# Patient Record
Sex: Male | Born: 1955
Health system: Southern US, Community
[De-identification: ages and names within clinical notes are randomized; demographics above are authoritative.]

## PROBLEM LIST (undated history)

## (undated) DIAGNOSIS — F329 Major depressive disorder, single episode, unspecified: Secondary | ICD-10-CM

## (undated) DIAGNOSIS — E039 Hypothyroidism, unspecified: Secondary | ICD-10-CM

## (undated) DIAGNOSIS — I10 Essential (primary) hypertension: Secondary | ICD-10-CM

## (undated) DIAGNOSIS — F32A Depression, unspecified: Secondary | ICD-10-CM

## (undated) DIAGNOSIS — F419 Anxiety disorder, unspecified: Secondary | ICD-10-CM

## (undated) DIAGNOSIS — E079 Disorder of thyroid, unspecified: Secondary | ICD-10-CM

## (undated) DIAGNOSIS — E119 Type 2 diabetes mellitus without complications: Secondary | ICD-10-CM

---

## 1995-06-14 HISTORY — PX: COSMETIC SURGERY: SHX468

## 2010-11-09 ENCOUNTER — Ambulatory Visit: Payer: Self-pay | Admitting: Internal Medicine

## 2012-11-12 ENCOUNTER — Ambulatory Visit: Payer: Self-pay | Admitting: Emergency Medicine

## 2012-11-12 LAB — DOT URINE DIP
Blood: NEGATIVE
Glucose,UR: NEGATIVE mg/dL (ref 0–75)
Protein: NEGATIVE
Specific Gravity: 1.02 (ref 1.003–1.030)

## 2012-11-17 IMAGING — CR DG CHEST 2V
1 series · 2 of 2 positions shown · non-contrast
Comparison: none

REASON FOR EXAM: Bilateral Wheezes; expectorating yellow phlegm
COMMENTS:   LMP: (Male)

PROCEDURE:     MDR - MDR CHEST PA(OR AP) AND LATERAL  - November 09, 2010  [DATE]
RESULT:     The lungs are clear. The heart and pulmonary vessels are normal.
The bony and mediastinal structures are unremarkable. There is no effusion.
There is no pneumothorax or evidence of congestive failure.

[Series 1: view not recorded · 0.17mm/px · 2 of 2 slices shown]
[im 1/2]
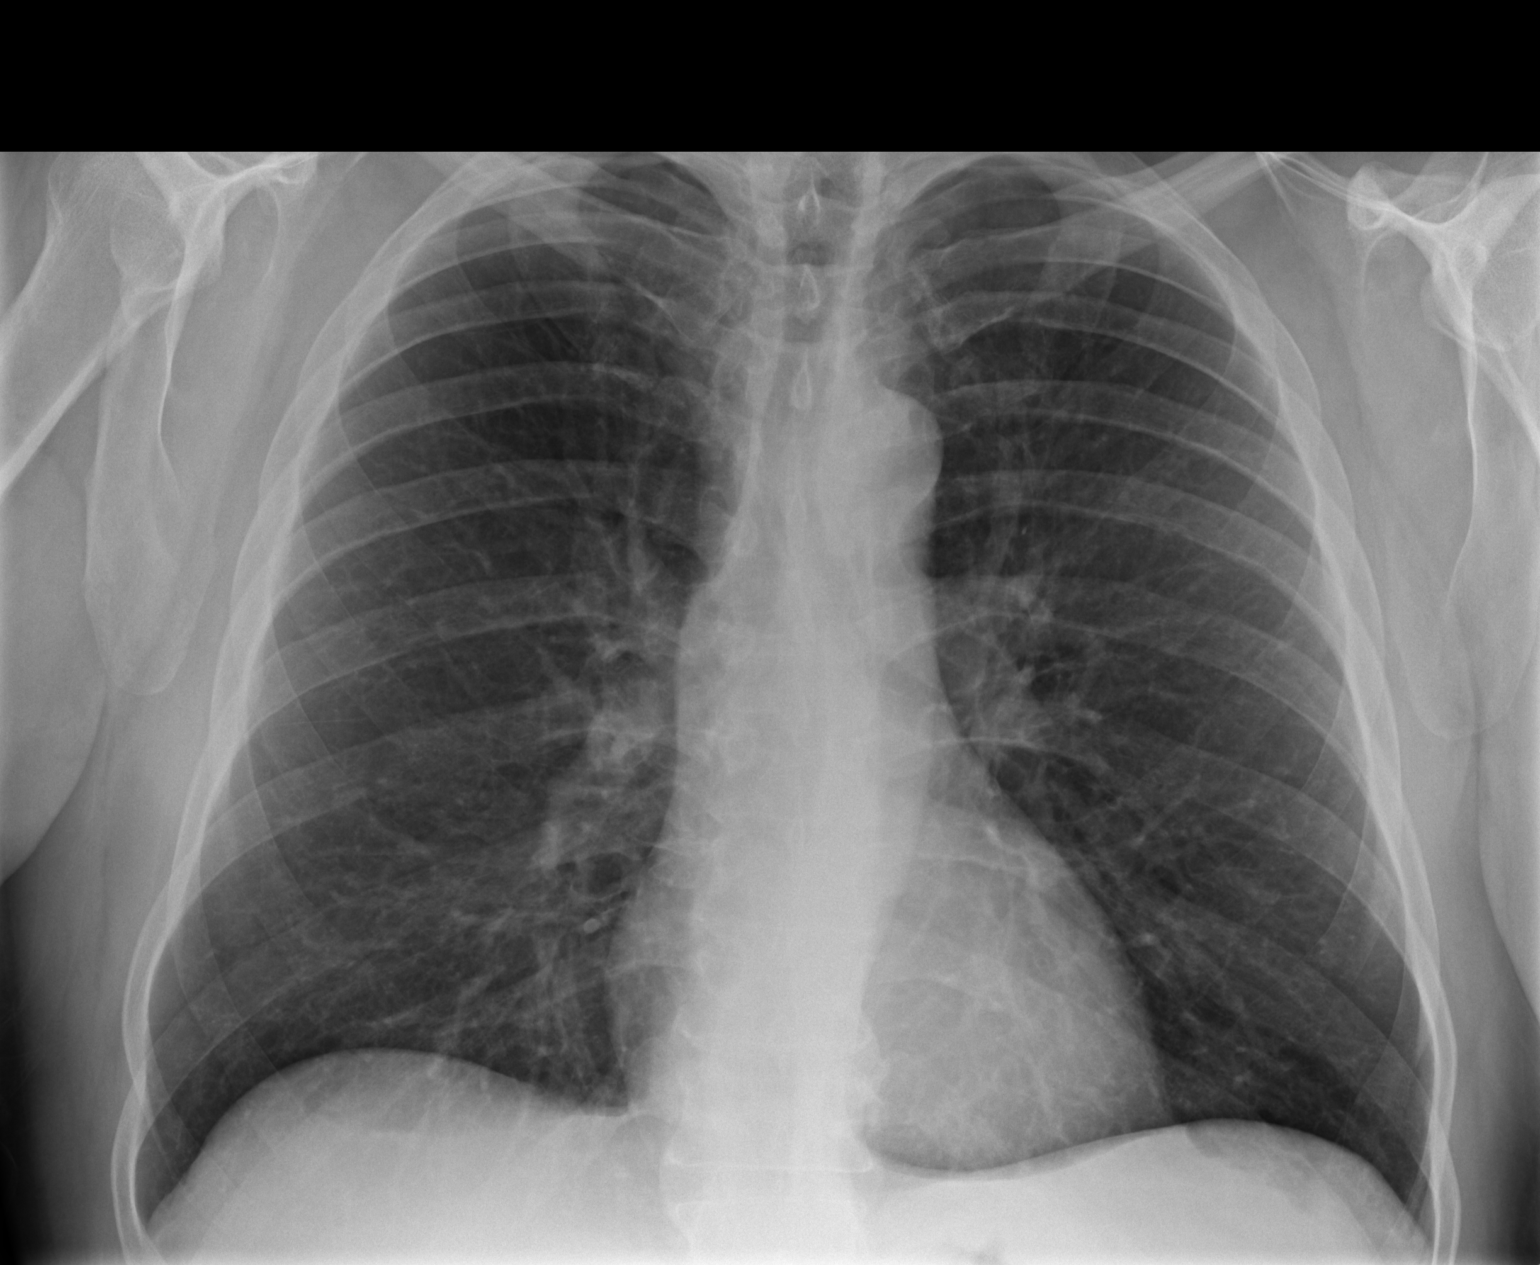
[im 2/2]
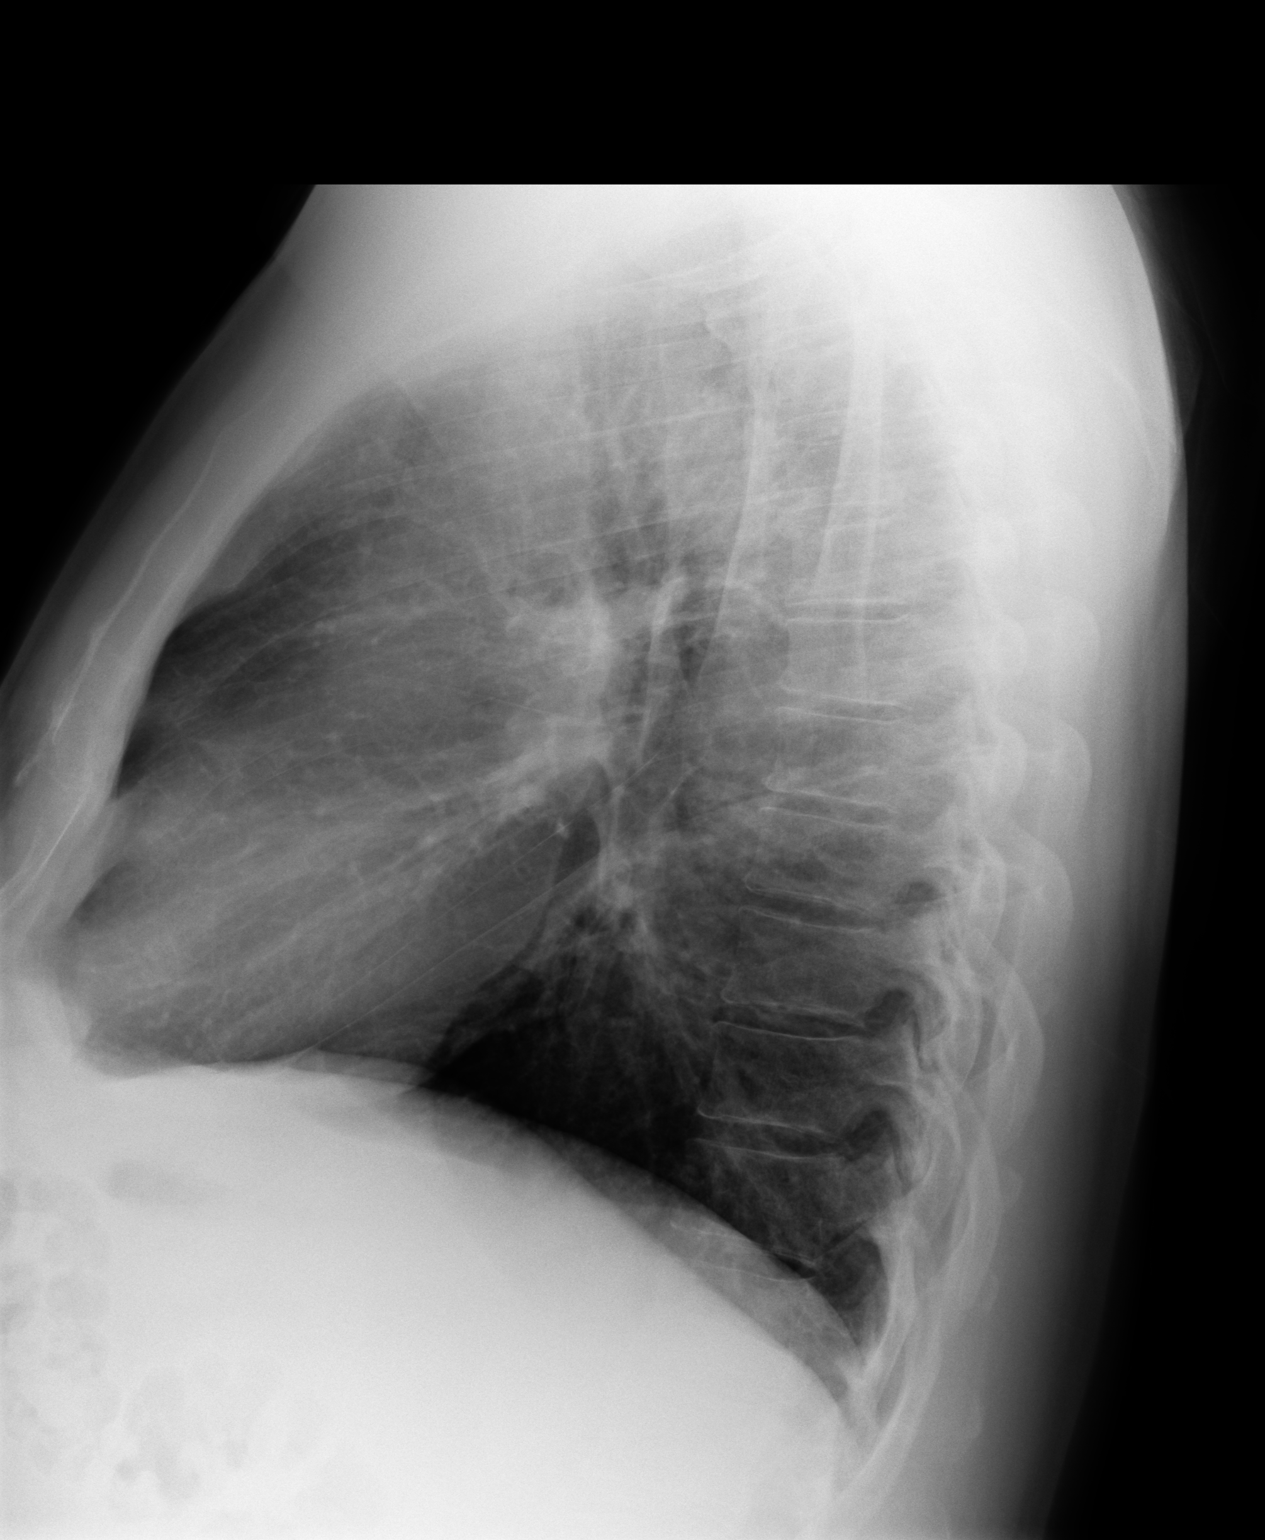

[2 of 2 positions shown; findings below may reference images not displayed]

IMPRESSION: No acute cardiopulmonary disease.

## 2014-11-04 ENCOUNTER — Encounter: Payer: Self-pay | Admitting: Family Medicine

## 2014-11-04 ENCOUNTER — Ambulatory Visit
Admission: EM | Admit: 2014-11-04 | Discharge: 2014-11-04 | Disposition: A | Discharge status: Home / Self Care | Payer: Self-pay | Attending: Family Medicine | Admitting: Family Medicine

## 2014-11-04 DIAGNOSIS — Z024 Encounter for examination for driving license: Secondary | ICD-10-CM

## 2014-11-04 DIAGNOSIS — Z029 Encounter for administrative examinations, unspecified: Secondary | ICD-10-CM

## 2014-11-04 HISTORY — DX: Type 2 diabetes mellitus without complications: E11.9

## 2014-11-04 HISTORY — DX: Essential (primary) hypertension: I10

## 2014-11-04 HISTORY — DX: Disorder of thyroid, unspecified: E07.9

## 2014-11-04 LAB — DEPT OF TRANSP DIPSTICK, URINE (ARMC ONLY)
Glucose, UA: NEGATIVE mg/dL
HGB URINE DIPSTICK: NEGATIVE
PROTEIN: NEGATIVE mg/dL
Specific Gravity, Urine: 1.025 (ref 1.005–1.030)

## 2014-11-04 NOTE — ED Notes (Signed)
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

## 2014-11-04 NOTE — Discharge Instructions (Signed)
Medical Screening Exam °A medical screening exam has been done. This exam helps find the cause of your problem and determines whether you need emergency treatment. Your exam has shown that you do not need emergency treatment at this point. It is safe for you to go to your caregiver's office or clinic for treatment. You should make an appointment today to see your caregiver as soon as he or she is available. °Depending on your illness, your symptoms and condition can change over time. If your condition gets worse or you develop new or troubling symptoms before you see your caregiver, you should return to the emergency department for further evaluation.  °Document Released: 07/07/2004 Document Revised: 08/22/2011 Document Reviewed: 02/16/2011 °ExitCare® Patient Information ©2015 [employer]. This information is not intended to replace advice given to you by your health care provider. Make sure you discuss any questions you have with your health care provider. ° °
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
## 2015-10-02 DIAGNOSIS — E784 Other hyperlipidemia: Secondary | ICD-10-CM | POA: Diagnosis not present

## 2015-10-02 DIAGNOSIS — C61 Malignant neoplasm of prostate: Secondary | ICD-10-CM | POA: Diagnosis not present

## 2015-10-02 DIAGNOSIS — I10 Essential (primary) hypertension: Secondary | ICD-10-CM | POA: Diagnosis not present

## 2015-10-09 DIAGNOSIS — I4510 Unspecified right bundle-branch block: Secondary | ICD-10-CM | POA: Diagnosis not present

## 2015-10-09 DIAGNOSIS — E784 Other hyperlipidemia: Secondary | ICD-10-CM | POA: Diagnosis not present

## 2015-10-23 DIAGNOSIS — H60399 Other infective otitis externa, unspecified ear: Secondary | ICD-10-CM | POA: Diagnosis not present

## 2015-10-29 DIAGNOSIS — H60399 Other infective otitis externa, unspecified ear: Secondary | ICD-10-CM | POA: Diagnosis not present

## 2015-11-06 DIAGNOSIS — H60399 Other infective otitis externa, unspecified ear: Secondary | ICD-10-CM | POA: Diagnosis not present

## 2015-11-06 DIAGNOSIS — E039 Hypothyroidism, unspecified: Secondary | ICD-10-CM | POA: Diagnosis not present

## 2015-11-06 DIAGNOSIS — H6690 Otitis media, unspecified, unspecified ear: Secondary | ICD-10-CM | POA: Diagnosis not present

## 2015-11-06 DIAGNOSIS — I10 Essential (primary) hypertension: Secondary | ICD-10-CM | POA: Diagnosis not present

## 2016-01-29 DIAGNOSIS — I4510 Unspecified right bundle-branch block: Secondary | ICD-10-CM | POA: Diagnosis not present

## 2016-01-29 DIAGNOSIS — H60399 Other infective otitis externa, unspecified ear: Secondary | ICD-10-CM | POA: Diagnosis not present

## 2016-01-29 DIAGNOSIS — I10 Essential (primary) hypertension: Secondary | ICD-10-CM | POA: Diagnosis not present

## 2016-01-29 DIAGNOSIS — E784 Other hyperlipidemia: Secondary | ICD-10-CM | POA: Diagnosis not present

## 2016-02-09 DIAGNOSIS — Z23 Encounter for immunization: Secondary | ICD-10-CM | POA: Diagnosis not present

## 2016-02-09 DIAGNOSIS — F17200 Nicotine dependence, unspecified, uncomplicated: Secondary | ICD-10-CM | POA: Diagnosis not present

## 2016-02-09 DIAGNOSIS — Z21 Asymptomatic human immunodeficiency virus [HIV] infection status: Secondary | ICD-10-CM | POA: Diagnosis not present

## 2016-02-09 DIAGNOSIS — W228XXA Striking against or struck by other objects, initial encounter: Secondary | ICD-10-CM | POA: Diagnosis not present

## 2016-02-09 DIAGNOSIS — S01511A Laceration without foreign body of lip, initial encounter: Secondary | ICD-10-CM | POA: Diagnosis not present

## 2016-06-03 DIAGNOSIS — E039 Hypothyroidism, unspecified: Secondary | ICD-10-CM | POA: Diagnosis not present

## 2016-06-03 DIAGNOSIS — E784 Other hyperlipidemia: Secondary | ICD-10-CM | POA: Diagnosis not present

## 2016-06-03 DIAGNOSIS — H6690 Otitis media, unspecified, unspecified ear: Secondary | ICD-10-CM | POA: Diagnosis not present

## 2016-06-03 DIAGNOSIS — H9190 Unspecified hearing loss, unspecified ear: Secondary | ICD-10-CM | POA: Diagnosis not present

## 2016-08-05 DIAGNOSIS — E039 Hypothyroidism, unspecified: Secondary | ICD-10-CM | POA: Diagnosis not present

## 2016-08-05 DIAGNOSIS — H6690 Otitis media, unspecified, unspecified ear: Secondary | ICD-10-CM | POA: Diagnosis not present

## 2016-08-05 DIAGNOSIS — E785 Hyperlipidemia, unspecified: Secondary | ICD-10-CM | POA: Diagnosis not present

## 2016-08-05 DIAGNOSIS — H9190 Unspecified hearing loss, unspecified ear: Secondary | ICD-10-CM | POA: Diagnosis not present

## 2016-11-21 DIAGNOSIS — I10 Essential (primary) hypertension: Secondary | ICD-10-CM | POA: Diagnosis not present

## 2016-11-21 DIAGNOSIS — E079 Disorder of thyroid, unspecified: Secondary | ICD-10-CM | POA: Diagnosis not present

## 2016-11-21 DIAGNOSIS — H60399 Other infective otitis externa, unspecified ear: Secondary | ICD-10-CM | POA: Diagnosis not present

## 2016-11-21 DIAGNOSIS — J209 Acute bronchitis, unspecified: Secondary | ICD-10-CM | POA: Diagnosis not present

## 2016-11-22 DIAGNOSIS — E784 Other hyperlipidemia: Secondary | ICD-10-CM | POA: Diagnosis not present

## 2016-11-22 DIAGNOSIS — I10 Essential (primary) hypertension: Secondary | ICD-10-CM | POA: Diagnosis not present

## 2016-11-22 DIAGNOSIS — Z125 Encounter for screening for malignant neoplasm of prostate: Secondary | ICD-10-CM | POA: Diagnosis not present

## 2016-11-22 DIAGNOSIS — R5381 Other malaise: Secondary | ICD-10-CM | POA: Diagnosis not present

## 2016-11-30 DIAGNOSIS — E119 Type 2 diabetes mellitus without complications: Secondary | ICD-10-CM | POA: Diagnosis not present

## 2016-11-30 DIAGNOSIS — E079 Disorder of thyroid, unspecified: Secondary | ICD-10-CM | POA: Diagnosis not present

## 2016-11-30 DIAGNOSIS — H6690 Otitis media, unspecified, unspecified ear: Secondary | ICD-10-CM | POA: Diagnosis not present

## 2016-11-30 DIAGNOSIS — H60399 Other infective otitis externa, unspecified ear: Secondary | ICD-10-CM | POA: Diagnosis not present

## 2016-12-02 DIAGNOSIS — E119 Type 2 diabetes mellitus without complications: Secondary | ICD-10-CM | POA: Diagnosis not present

## 2017-04-28 DIAGNOSIS — D485 Neoplasm of uncertain behavior of skin: Secondary | ICD-10-CM | POA: Diagnosis not present

## 2017-04-28 DIAGNOSIS — Z85828 Personal history of other malignant neoplasm of skin: Secondary | ICD-10-CM | POA: Diagnosis not present

## 2017-04-28 DIAGNOSIS — B079 Viral wart, unspecified: Secondary | ICD-10-CM | POA: Diagnosis not present

## 2017-07-25 DIAGNOSIS — K644 Residual hemorrhoidal skin tags: Secondary | ICD-10-CM | POA: Diagnosis not present

## 2017-07-25 DIAGNOSIS — K648 Other hemorrhoids: Secondary | ICD-10-CM | POA: Diagnosis not present

## 2017-07-28 ENCOUNTER — Other Ambulatory Visit: Payer: Self-pay

## 2017-07-28 ENCOUNTER — Encounter
Admission: RE | Admit: 2017-07-28 | Discharge: 2017-07-28 | Disposition: A | Discharge status: Home / Self Care | Payer: BLUE CROSS/BLUE SHIELD | Source: Ambulatory Visit | Attending: Surgery | Admitting: Surgery

## 2017-07-28 DIAGNOSIS — Z01812 Encounter for preprocedural laboratory examination: Secondary | ICD-10-CM | POA: Diagnosis not present

## 2017-07-28 DIAGNOSIS — I4510 Unspecified right bundle-branch block: Secondary | ICD-10-CM | POA: Insufficient documentation

## 2017-07-28 DIAGNOSIS — F329 Major depressive disorder, single episode, unspecified: Secondary | ICD-10-CM | POA: Insufficient documentation

## 2017-07-28 DIAGNOSIS — I517 Cardiomegaly: Secondary | ICD-10-CM | POA: Insufficient documentation

## 2017-07-28 DIAGNOSIS — F419 Anxiety disorder, unspecified: Secondary | ICD-10-CM | POA: Insufficient documentation

## 2017-07-28 DIAGNOSIS — I10 Essential (primary) hypertension: Secondary | ICD-10-CM | POA: Insufficient documentation

## 2017-07-28 DIAGNOSIS — E119 Type 2 diabetes mellitus without complications: Secondary | ICD-10-CM | POA: Diagnosis not present

## 2017-07-28 DIAGNOSIS — Z01810 Encounter for preprocedural cardiovascular examination: Secondary | ICD-10-CM | POA: Insufficient documentation

## 2017-07-28 DIAGNOSIS — E039 Hypothyroidism, unspecified: Secondary | ICD-10-CM | POA: Insufficient documentation

## 2017-07-28 HISTORY — DX: Major depressive disorder, single episode, unspecified: F32.9

## 2017-07-28 HISTORY — DX: Anxiety disorder, unspecified: F41.9

## 2017-07-28 HISTORY — DX: Depression, unspecified: F32.A

## 2017-07-28 HISTORY — DX: Hypothyroidism, unspecified: E03.9

## 2017-07-28 LAB — COMPREHENSIVE METABOLIC PANEL
ALT: 19 U/L (ref 17–63)
AST: 16 U/L (ref 15–41)
Albumin: 3.9 g/dL (ref 3.5–5.0)
Alkaline Phosphatase: 71 U/L (ref 38–126)
Anion gap: 9 (ref 5–15)
BUN: 16 mg/dL (ref 6–20)
CO2: 28 mmol/L (ref 22–32)
Calcium: 9.3 mg/dL (ref 8.9–10.3)
Chloride: 99 mmol/L — ABNORMAL LOW (ref 101–111)
Creatinine, Ser: 0.82 mg/dL (ref 0.61–1.24)
GFR calc Af Amer: >60 mL/min (ref >60)
GFR calc non Af Amer: >60 mL/min (ref >60)
Glucose, Bld: 144 mg/dL — ABNORMAL HIGH (ref 65–99)
Potassium: 3.8 mmol/L (ref 3.5–5.1)
Sodium: 136 mmol/L (ref 135–145)
Total Bilirubin: 0.6 mg/dL (ref 0.3–1.2)
Total Protein: 7 g/dL (ref 6.5–8.1)

## 2017-07-28 LAB — CBC
HCT: 47.6 % (ref 40.0–52.0)
Hemoglobin: 16 g/dL (ref 13.0–18.0)
MCH: 29.8 pg (ref 26.0–34.0)
MCHC: 33.6 g/dL (ref 32.0–36.0)
MCV: 88.8 fL (ref 80.0–100.0)
Platelets: 379 10*3/uL (ref 150–440)
RBC: 5.36 MIL/uL (ref 4.40–5.90)
RDW: 13.2 % (ref 11.5–14.5)
WBC: 10.9 10*3/uL — ABNORMAL HIGH (ref 3.8–10.6)

## 2017-07-28 NOTE — Pre-Procedure Instructions (Signed)
Cardiac clearance requested faxed to [facility]

## 2017-07-28 NOTE — Patient Instructions (Signed)
Your procedure is scheduled on: August 03, 2017 THURSDAY Report to Same Day Surgery on the 2nd floor in the [facility]. To find out your arrival time, please call [phone] between 1PM - 3PM on: Wednesday August 02, 2017  REMEMBER: Instructions that are not followed completely may result in serious medical risk, up to and including death; or upon the discretion of your surgeon and anesthesiologist your surgery may need to be rescheduled.  Do not eat food after midnight the night before your procedure.  No gum chewing or hard candies.  You may however, drink CLEAR liquids up to 2 hours before you are scheduled to arrive at the hospital for your procedure.  Do not drink clear liquids within 2 hours of the start of your surgery.  Clear liquids include: - water  Do NOT drink anything that is not on this list.  Type 1 and Type 2 diabetics should only drink water.  No Alcohol for 24 hours before or after surgery.  No Smoking including e-cigarettes for 24 hours prior to surgery. No chewable tobacco products for at least 6 hours prior to surgery. No nicotine patches on the day of surgery.  On the morning of surgery brush your teeth with toothpaste and water, you may rinse your mouth with mouthwash if you wish. Do not swallow any  toothpaste OR mouthwash.  Notify your doctor if there is any change in your medical condition (cold, fever, infection).  Do not wear jewelry, make-up, hairpins, clips or nail polish.  Do not wear lotions, powders, or perfumes. You may NOT wear deodorant.  Do not shave 48 hours prior to surgery. Men may shave face and neck.  Contacts and dentures may not be worn into surgery.  Do not bring valuables to the hospital. [facility] is [facility] responsible for any belongings or valuables.   TAKE THESE MEDICATIONS THE MORNING OF SURGERY WITH A SIP OF WATER: LEVOTHYROXINE   Use CHG Soap or wipes as directed on instruction sheet.  Use inhalers on the day of  surgery and bring to the hospital.  Stop Metformin 2 days prior to surgery.  Follow recommendations from Cardiologist, Pulmonologist or PCP regarding stopping Aspirin, Coumadin, Plavix, Eliquis, Pradaxa, or Pletal.  Stop Anti-inflammatories such as Advil, Aleve, Ibuprofen, Motrin, Naproxen, Naprosyn, Goodie powder, or aspirin products. (May take Tylenol or Acetaminophen if needed.)   If you are being discharged the day of surgery, you will not be allowed to drive home. You will need someone to drive you home and stay with you that night.   If you are taking public transportation, you will need to have a responsible adult to with you.  Please call the number above if you have any questions about these instructions.

## 2017-08-01 DIAGNOSIS — E079 Disorder of thyroid, unspecified: Secondary | ICD-10-CM | POA: Diagnosis not present

## 2017-08-01 DIAGNOSIS — Z01818 Encounter for other preprocedural examination: Secondary | ICD-10-CM | POA: Diagnosis not present

## 2017-08-01 DIAGNOSIS — I4510 Unspecified right bundle-branch block: Secondary | ICD-10-CM | POA: Diagnosis not present

## 2017-08-01 DIAGNOSIS — H9190 Unspecified hearing loss, unspecified ear: Secondary | ICD-10-CM | POA: Diagnosis not present

## 2017-08-01 NOTE — Pre-Procedure Instructions (Signed)
[facility]. PATIENT HAS APPT AT [facility] TODAY AT 1130 FOR CLEARANCE.

## 2017-08-03 ENCOUNTER — Encounter
Admission: RE | Disposition: A | Discharge status: Home / Self Care | Payer: Self-pay | Source: Ambulatory Visit | Attending: Surgery

## 2017-08-03 ENCOUNTER — Encounter: Payer: Self-pay | Admitting: *Deleted

## 2017-08-03 ENCOUNTER — Ambulatory Visit
Admission: RE | Admit: 2017-08-03 | Discharge: 2017-08-03 | Disposition: A | Discharge status: Home / Self Care | Payer: BLUE CROSS/BLUE SHIELD | Source: Ambulatory Visit | Attending: Surgery | Admitting: Surgery

## 2017-08-03 ENCOUNTER — Ambulatory Visit: Payer: BLUE CROSS/BLUE SHIELD | Admitting: Anesthesiology

## 2017-08-03 ENCOUNTER — Other Ambulatory Visit: Payer: Self-pay

## 2017-08-03 DIAGNOSIS — E119 Type 2 diabetes mellitus without complications: Secondary | ICD-10-CM | POA: Insufficient documentation

## 2017-08-03 DIAGNOSIS — E785 Hyperlipidemia, unspecified: Secondary | ICD-10-CM | POA: Insufficient documentation

## 2017-08-03 DIAGNOSIS — F17210 Nicotine dependence, cigarettes, uncomplicated: Secondary | ICD-10-CM | POA: Diagnosis not present

## 2017-08-03 DIAGNOSIS — K644 Residual hemorrhoidal skin tags: Secondary | ICD-10-CM | POA: Insufficient documentation

## 2017-08-03 DIAGNOSIS — I10 Essential (primary) hypertension: Secondary | ICD-10-CM | POA: Insufficient documentation

## 2017-08-03 DIAGNOSIS — K648 Other hemorrhoids: Secondary | ICD-10-CM | POA: Diagnosis not present

## 2017-08-03 DIAGNOSIS — Z833 Family history of diabetes mellitus: Secondary | ICD-10-CM | POA: Insufficient documentation

## 2017-08-03 DIAGNOSIS — Z79890 Hormone replacement therapy (postmenopausal): Secondary | ICD-10-CM | POA: Diagnosis not present

## 2017-08-03 DIAGNOSIS — Z79899 Other long term (current) drug therapy: Secondary | ICD-10-CM | POA: Diagnosis not present

## 2017-08-03 DIAGNOSIS — E039 Hypothyroidism, unspecified: Secondary | ICD-10-CM | POA: Insufficient documentation

## 2017-08-03 DIAGNOSIS — Z7984 Long term (current) use of oral hypoglycemic drugs: Secondary | ICD-10-CM | POA: Diagnosis not present

## 2017-08-03 HISTORY — PX: HEMORRHOID SURGERY: SHX153

## 2017-08-03 LAB — URINE DRUG SCREEN, QUALITATIVE (ARMC ONLY)
AMPHETAMINES, UR SCREEN: NOT DETECTED
BENZODIAZEPINE, UR SCRN: NOT DETECTED
Barbiturates, Ur Screen: NOT DETECTED
Cannabinoid 50 Ng, Ur ~~LOC~~: POSITIVE — AB
Cocaine Metabolite,Ur ~~LOC~~: NOT DETECTED
MDMA (Ecstasy)Ur Screen: NOT DETECTED
METHADONE SCREEN, URINE: NOT DETECTED
Opiate, Ur Screen: NOT DETECTED
Phencyclidine (PCP) Ur S: NOT DETECTED
TRICYCLIC, UR SCREEN: NOT DETECTED

## 2017-08-03 LAB — GLUCOSE, CAPILLARY
Glucose-Capillary: 137 mg/dL — ABNORMAL HIGH (ref 65–99)
Glucose-Capillary: 137 mg/dL — ABNORMAL HIGH (ref 65–99)

## 2017-08-03 SURGERY — HEMORRHOIDECTOMY
Anesthesia: General | Wound class: Clean Contaminated

## 2017-08-03 MED ORDER — BUPIVACAINE LIPOSOME 1.3 % IJ SUSP
INTRAMUSCULAR | Status: AC
  Filled 2017-08-03: qty 20

## 2017-08-03 MED ORDER — FENTANYL CITRATE (PF) 100 MCG/2ML IJ SOLN
25.0000 ug | INTRAMUSCULAR | Status: DC | PRN
  Administered 2017-08-03 (×3): 50 ug via INTRAVENOUS

## 2017-08-03 MED ORDER — FENTANYL CITRATE (PF) 100 MCG/2ML IJ SOLN
INTRAMUSCULAR | Status: AC
  Filled 2017-08-03: qty 2

## 2017-08-03 MED ORDER — BUPIVACAINE-EPINEPHRINE (PF) 0.5% -1:200000 IJ SOLN
INTRAMUSCULAR | Status: DC | PRN
  Administered 2017-08-03: 6 mL

## 2017-08-03 MED ORDER — DEXMEDETOMIDINE HCL 200 MCG/2ML IV SOLN
INTRAVENOUS | Status: DC | PRN
  Administered 2017-08-03 (×2): 4 ug via INTRAVENOUS

## 2017-08-03 MED ORDER — MEPERIDINE HCL 50 MG/ML IJ SOLN: 6.2500 mg | INTRAMUSCULAR | Status: DC | PRN

## 2017-08-03 MED ORDER — PROPOFOL 10 MG/ML IV BOLUS
INTRAVENOUS | Status: DC | PRN
  Administered 2017-08-03 (×2): 200 mg via INTRAVENOUS

## 2017-08-03 MED ORDER — LACTATED RINGERS IV SOLN
INTRAVENOUS | Status: DC | PRN
  Administered 2017-08-03: 09:00:00 via INTRAVENOUS

## 2017-08-03 MED ORDER — BUPIVACAINE-EPINEPHRINE (PF) 0.5% -1:200000 IJ SOLN
INTRAMUSCULAR | Status: AC
  Filled 2017-08-03: qty 30

## 2017-08-03 MED ORDER — FAMOTIDINE 20 MG PO TABS
ORAL_TABLET | ORAL | Status: AC
  Administered 2017-08-03: 20 mg via ORAL
  Filled 2017-08-03: qty 1

## 2017-08-03 MED ORDER — FENTANYL CITRATE (PF) 100 MCG/2ML IJ SOLN
INTRAMUSCULAR | Status: AC
  Administered 2017-08-03: 50 ug via INTRAVENOUS
  Filled 2017-08-03: qty 2

## 2017-08-03 MED ORDER — BUPIVACAINE LIPOSOME 1.3 % IJ SUSP
INTRAMUSCULAR | Status: DC | PRN
  Administered 2017-08-03: 20 mL

## 2017-08-03 MED ORDER — SODIUM CHLORIDE 0.9 % IV SOLN
INTRAVENOUS | Status: DC
  Administered 2017-08-03: 08:00:00 via INTRAVENOUS

## 2017-08-03 MED ORDER — PROMETHAZINE HCL 25 MG/ML IJ SOLN: 6.2500 mg | INTRAMUSCULAR | Status: DC | PRN

## 2017-08-03 MED ORDER — OXYCODONE HCL 5 MG/5ML PO SOLN: 5.0000 mg | Freq: Once | ORAL | Status: AC | PRN

## 2017-08-03 MED ORDER — OXYCODONE HCL 5 MG PO TABS
5.0000 mg | ORAL_TABLET | Freq: Once | ORAL | Status: AC | PRN
  Administered 2017-08-03: 5 mg via ORAL

## 2017-08-03 MED ORDER — FENTANYL CITRATE (PF) 100 MCG/2ML IJ SOLN
INTRAMUSCULAR | Status: DC | PRN
  Administered 2017-08-03 (×2): 50 ug via INTRAVENOUS
  Administered 2017-08-03: 100 ug via INTRAVENOUS

## 2017-08-03 MED ORDER — PHENYLEPHRINE HCL 10 MG/ML IJ SOLN
INTRAMUSCULAR | Status: DC | PRN
  Administered 2017-08-03: 200 ug via INTRAVENOUS

## 2017-08-03 MED ORDER — OXYCODONE HCL 5 MG PO TABS
ORAL_TABLET | ORAL | Status: AC
  Administered 2017-08-03: 5 mg via ORAL
  Filled 2017-08-03: qty 1

## 2017-08-03 MED ORDER — OXYCODONE HCL 5 MG PO TABS: 5.0000 mg | ORAL_TABLET | ORAL | 0 refills | Status: AC | PRN

## 2017-08-03 MED ORDER — FAMOTIDINE 20 MG PO TABS
20.0000 mg | ORAL_TABLET | Freq: Once | ORAL | Status: AC
  Administered 2017-08-03: 20 mg via ORAL

## 2017-08-03 MED ORDER — LIDOCAINE HCL (CARDIAC) 20 MG/ML IV SOLN
INTRAVENOUS | Status: DC | PRN
  Administered 2017-08-03: 100 mg via INTRAVENOUS

## 2017-08-03 SURGICAL SUPPLY — 30 items
BLADE SURG 15 STRL LF DISP TIS (BLADE) ×1 IMPLANT
BLADE SURG 15 STRL SS (BLADE) ×1
CANISTER SUCT 1200ML W/VALVE (MISCELLANEOUS) ×2 IMPLANT
DRAPE LAPAROTOMY 100X77 ABD (DRAPES) ×2 IMPLANT
DRAPE LEGGINS SURG 28X43 STRL (DRAPES) ×2 IMPLANT
DRAPE UNDER BUTTOCK W/FLU (DRAPES) ×2 IMPLANT
ELECT REM PT RETURN 9FT ADLT (ELECTROSURGICAL) ×2
ELECTRODE REM PT RTRN 9FT ADLT (ELECTROSURGICAL) ×1 IMPLANT
GAUZE SPONGE 4X4 12PLY STRL (GAUZE/BANDAGES/DRESSINGS) ×2 IMPLANT
GLOVE BIO SURGEON STRL SZ7.5 (GLOVE) ×2 IMPLANT
GOWN STRL REUS W/ TWL LRG LVL3 (GOWN DISPOSABLE) ×2 IMPLANT
GOWN STRL REUS W/TWL LRG LVL3 (GOWN DISPOSABLE) ×2
LABEL OR SOLS (LABEL) ×2 IMPLANT
NEEDLE HYPO 25X1 1.5 SAFETY (NEEDLE) ×2 IMPLANT
NS IRRIG 500ML POUR BTL (IV SOLUTION) ×2 IMPLANT
PACK BASIN MINOR ARMC (MISCELLANEOUS) ×2 IMPLANT
PAD ABD DERMACEA PRESS 5X9 (GAUZE/BANDAGES/DRESSINGS) ×2 IMPLANT
PAD PREP 24X41 OB/GYN DISP (PERSONAL CARE ITEMS) ×2 IMPLANT
SHEARS HARMONIC 9CM CVD (BLADE) ×2 IMPLANT
SOL PREP PVP 2OZ (MISCELLANEOUS) ×2
SOLUTION PREP PVP 2OZ (MISCELLANEOUS) ×1 IMPLANT
STAPLER PROXIMATE HCS (STAPLE) IMPLANT
STRAP SAFETY 5IN WIDE (MISCELLANEOUS) ×2 IMPLANT
SURGILUBE 2OZ TUBE FLIPTOP (MISCELLANEOUS) ×2 IMPLANT
SUT CHROMIC 2 0 SH (SUTURE) IMPLANT
SUT CHROMIC 3 0 SH 27 (SUTURE) ×2 IMPLANT
SUT ETHILON 3-0 FS-10 30 BLK (SUTURE) ×2
SUT PROLENE 3 0 PS 2 (SUTURE) IMPLANT
SUTURE EHLN 3-0 FS-10 30 BLK (SUTURE) ×1 IMPLANT
SYR 10ML LL (SYRINGE) ×2 IMPLANT

## 2017-08-03 NOTE — Transfer of Care (Signed)
Immediate Anesthesia Transfer of Care Note  Patient: Blake Williamson  Procedure(s) Performed: HEMORRHOIDECTOMY, INTERNAL AND EXTERNAL (N/A )  Patient Location: PACU  Anesthesia Type:General  Level of Consciousness: awake, alert  and oriented  Airway & Oxygen Therapy: Patient Spontanous Breathing and Patient connected to face mask oxygen  Post-op Assessment: Report given to RN and Post -op Vital signs reviewed and stable  Post vital signs: Reviewed and stable  Last Vitals:  Vitals:   08/03/17 0812  BP: (!) 147/74  Pulse: 72  Resp: 16  Temp: 36.7 C  SpO2: 99%    Last Pain:  Vitals:   08/03/17 0812  TempSrc: Oral  PainSc: 0-No pain         Complications: No apparent anesthesia complications

## 2017-08-03 NOTE — Anesthesia Postprocedure Evaluation (Signed)
Anesthesia Post Note  Patient: Blake Williamson  Procedure(s) Performed: HEMORRHOIDECTOMY, INTERNAL AND EXTERNAL (N/A )  Patient location during evaluation: PACU Anesthesia Type: General Level of consciousness: awake and alert and oriented Pain management: pain level controlled Vital Signs Assessment: post-procedure vital signs reviewed and stable Respiratory status: spontaneous breathing, nonlabored ventilation and respiratory function stable Cardiovascular status: blood pressure returned to baseline and stable Postop Assessment: no signs of nausea or vomiting Anesthetic complications: no     Last Vitals:  Vitals:   08/03/17 1108 08/03/17 1116  BP: 135/74 (!) (P) 142/91  Pulse: 64 (P) 63  Resp: 20 (P) 18  Temp:  (P) 36.7 C  SpO2: 94% (P) 98%    Last Pain:  Vitals:   08/03/17 1116  TempSrc: (P) Tympanic  PainSc:                  Edwards Mckelvie

## 2017-08-03 NOTE — Anesthesia Preprocedure Evaluation (Signed)
Anesthesia Evaluation  Patient identified by MRN, date of birth, ID band Patient awake    Reviewed: Allergy & Precautions, NPO status , Patient's Chart, lab work & pertinent test results  History of Anesthesia Complications Negative for: history of anesthetic complications  Airway Mallampati: III  TM Distance: >3 FB Neck ROM: Full    Dental no notable dental hx.    Pulmonary neg sleep apnea, neg COPD, Current Smoker,    breath sounds clear to auscultation- rhonchi (-) wheezing      Cardiovascular Exercise Tolerance: Good hypertension, Pt. on medications (-) CAD, (-) Past MI and (-) Cardiac Stents  Rhythm:Regular Rate:Normal - Systolic murmurs and - Diastolic murmurs    Neuro/Psych PSYCHIATRIC DISORDERS Anxiety Depression negative neurological ROS     GI/Hepatic negative GI ROS, Neg liver ROS,   Endo/Other  diabetes (diet controlled)Hypothyroidism   Renal/GU negative Renal ROS     Musculoskeletal negative musculoskeletal ROS (+)   Abdominal (+) + obese,   Peds  Hematology negative hematology ROS (+)   Anesthesia Other Findings Past Medical History: No date: Anxiety No date: Depression No date: Diabetes mellitus without complication (HCC) No date: Hypertension No date: Hypothyroidism No date: Thyroid disease   Reproductive/Obstetrics                             Anesthesia Physical Anesthesia Plan  ASA: II  Anesthesia Plan: General   Post-op Pain Management:    Induction: Intravenous  PONV Risk Score and Plan: 0 and Ondansetron  Airway Management Planned: LMA  Additional Equipment:   Intra-op Plan:   Post-operative Plan:   Informed Consent: I have reviewed the patients History and Physical, chart, labs and discussed the procedure including the risks, benefits and alternatives for the proposed anesthesia with the patient or authorized representative who has indicated  his/her understanding and acceptance.   Dental advisory given  Plan Discussed with: CRNA and Anesthesiologist  Anesthesia Plan Comments:         Anesthesia Quick Evaluation

## 2017-08-03 NOTE — Anesthesia Procedure Notes (Signed)
Procedure Name: LMA Insertion Date/Time: 08/03/2017 9:08 AM Performed by: Danelle BerryWarr, Darra Rosa E, CRNA Pre-anesthesia Checklist: Patient identified, Emergency Drugs available, Suction available, Patient being monitored and Timeout performed Patient Re-evaluated:Patient Re-evaluated prior to induction Oxygen Delivery Method: Circle system utilized and Simple face mask Preoxygenation: Pre-oxygenation with 100% oxygen Induction Type: IV induction Ventilation: Mask ventilation without difficulty LMA Size: 5.0 Number of attempts: 2

## 2017-08-03 NOTE — Discharge Instructions (Addendum)
Take Tylenol if needed for minor pain.  Take Tylenol and oxycodone if needed for more pain.  Take stool softener 2 times per day.  Remove dressing later today or tomorrow.  May then shower and/or sit in warm water.  Tuck gauze or a pad in underwear and change as needed    AMBULATORY SURGERY  DISCHARGE INSTRUCTIONS   1) The drugs that you were given will stay in your system until tomorrow so for the next 24 hours you should not:  A) Drive an automobile B) Make any legal decisions C) Drink any alcoholic beverage   2) You may resume regular meals tomorrow.  Today it is better to start with liquids and gradually work up to solid foods.  You may eat anything you prefer, but it is better to start with liquids, then soup and crackers, and gradually work up to solid foods.   3) Please notify your doctor immediately if you have any unusual bleeding, trouble breathing, redness and pain at the surgery site, drainage, fever, or pain not relieved by medication.    4) Additional Instructions:        Please contact your physician with any problems or Same Day Surgery at [phone], Monday through Friday 6 am to 4 pm, or [place] at [facility] Main number at [phone].

## 2017-08-03 NOTE — Op Note (Signed)
OPERATIVE REPORT  PREOPERATIVE  DIAGNOSIS: .  Internal and external hemorrhoids  POSTOPERATIVE DIAGNOSIS: .  Internal and external hemorrhoids  PROCEDURE: .  Internal and external hemorrhoidectomy, internal hemorrhoid ligation  ANESTHESIA:  General  SURGEON: Renda RollsWilton Smith  MD   INDICATIONS: .  He has a history of anal pain and swelling and minimal history of bleeding.  He had a large hemorrhoid found on the left side and posterior.  He also had findings of internal hemorrhoids.  Surgery was recommended for definitive treatment  With the patient on the operating table in the supine position under general anesthesia the legs were elevated into the lithotomy position using ankle straps.  The scrotum was retracted with paper tape.  The anal area was repaired with Betadine and draped with sterile towels and sheets.  The large hemorrhoid was noted which was posterior and on the patient's left side at 5 o'clock position.  The skin surrounding this was infiltrated with 0.5% Sensorcaine with epinephrine.  The anal canal was dilated large enough to admit 3 fingers.  The bivalve anal retractor was introduced.  There were large internal hemorrhoids.  No polyps or tumors were seen.  At the 5 o'clock position 3-0 chromic suture was placed at the upper extent of the internal hemorrhoid.  Next V-shaped incision was made externally.  Dissection was carried out with electrocautery.  The internal anal sphincter was identified and left intact.  Dissection was carried out up above the internal sphincter up to the previously placed suture ligature.  The hemorrhoid was further ligated and was amputated and submitted for pathology.  Several small bleeding points were cauterized.  The wound was closed with an interrupted 3-0 chromic locked tied stitch leaving a small opening externally for drainage.  At the 9 o'clock position and 1 o'clock position large internal hemorrhoids were grasped with Kelly clamps and ligated with 2-0  chromic.  Hemostasis was intact.  The retractor was removed.  The site was further prepared with Betadine and then infiltrated with 20 cc of Exparel.  Dressings were applied with paper tape  The patient tolerated surgery satisfactorily and was then prepared for transfer to the recovery room  Renda RollsWilton Smith MD

## 2017-08-03 NOTE — H&P (Signed)
  He comes in today for an internal and external hemorrhoidectomy.  He does continue to have localized swelling and also some occasional bleeding.  He reports no change in overall condition since the recent office exam.  Lab work was reviewed.  Blood sugar was elevated at 144.  I discussed the plan for surgery and postoperative care.

## 2017-08-03 NOTE — Anesthesia Post-op Follow-up Note (Signed)
Anesthesia QCDR form completed.        

## 2017-08-03 NOTE — Anesthesia Procedure Notes (Signed)
Performed by: Briggette Najarian E, CRNA       

## 2017-08-04 LAB — SURGICAL PATHOLOGY

## 2017-10-18 DIAGNOSIS — K610 Anal abscess: Secondary | ICD-10-CM | POA: Diagnosis not present

## 2017-10-18 DIAGNOSIS — K644 Residual hemorrhoidal skin tags: Secondary | ICD-10-CM | POA: Diagnosis not present

## 2017-10-24 DIAGNOSIS — K610 Anal abscess: Secondary | ICD-10-CM | POA: Diagnosis not present

## 2018-05-16 DIAGNOSIS — H60339 Swimmer's ear, unspecified ear: Secondary | ICD-10-CM | POA: Diagnosis not present

## 2018-05-16 DIAGNOSIS — T162XXA Foreign body in left ear, initial encounter: Secondary | ICD-10-CM | POA: Diagnosis not present

## 2018-06-15 DIAGNOSIS — Z08 Encounter for follow-up examination after completed treatment for malignant neoplasm: Secondary | ICD-10-CM | POA: Diagnosis not present

## 2018-06-15 DIAGNOSIS — L309 Dermatitis, unspecified: Secondary | ICD-10-CM | POA: Diagnosis not present

## 2018-06-15 DIAGNOSIS — Z85828 Personal history of other malignant neoplasm of skin: Secondary | ICD-10-CM | POA: Diagnosis not present

## 2018-06-15 DIAGNOSIS — B078 Other viral warts: Secondary | ICD-10-CM | POA: Diagnosis not present

## 2020-01-09 ENCOUNTER — Other Ambulatory Visit: Payer: Self-pay | Admitting: *Deleted

## 2020-01-09 MED ORDER — LISINOPRIL-HYDROCHLOROTHIAZIDE 20-25 MG PO TABS: 1.0000 | ORAL_TABLET | Freq: Every day | ORAL | 1 refills | Status: DC

## 2020-01-09 MED ORDER — METFORMIN HCL 500 MG PO TABS: 500.0000 mg | ORAL_TABLET | Freq: Every day | ORAL | 1 refills | Status: DC

## 2020-01-09 MED ORDER — LEVOTHYROXINE SODIUM 175 MCG PO TABS: 175.0000 ug | ORAL_TABLET | Freq: Every day | ORAL | 1 refills | Status: DC

## 2020-04-14 ENCOUNTER — Other Ambulatory Visit: Payer: Self-pay | Admitting: *Deleted

## 2020-04-14 MED ORDER — METFORMIN HCL 500 MG PO TABS
500.0000 mg | ORAL_TABLET | Freq: Every day | ORAL | 1 refills | Status: DC
Start: 2020-04-14 — End: 2020-08-14

## 2020-04-14 MED ORDER — LISINOPRIL-HYDROCHLOROTHIAZIDE 20-25 MG PO TABS: 1.0000 | ORAL_TABLET | Freq: Every day | ORAL | 1 refills | Status: DC

## 2020-04-14 MED ORDER — LEVOTHYROXINE SODIUM 175 MCG PO TABS: 175.0000 ug | ORAL_TABLET | Freq: Every day | ORAL | 1 refills | Status: DC

## 2020-08-14 ENCOUNTER — Other Ambulatory Visit: Payer: Self-pay

## 2020-08-14 ENCOUNTER — Encounter: Payer: Self-pay | Admitting: Family Medicine

## 2020-08-14 ENCOUNTER — Ambulatory Visit (INDEPENDENT_AMBULATORY_CARE_PROVIDER_SITE_OTHER): Payer: Self-pay | Admitting: Family Medicine

## 2020-08-14 VITALS — BP 160/96 | HR 74 | Ht 72.0 in | Wt 262.7 lb

## 2020-08-14 DIAGNOSIS — I10 Essential (primary) hypertension: Secondary | ICD-10-CM | POA: Insufficient documentation

## 2020-08-14 DIAGNOSIS — E119 Type 2 diabetes mellitus without complications: Secondary | ICD-10-CM | POA: Insufficient documentation

## 2020-08-14 LAB — POCT GLYCOSYLATED HEMOGLOBIN (HGB A1C): Hemoglobin A1C: 7.6 % — AB (ref 4.0–5.6)

## 2020-08-14 MED ORDER — PRAVASTATIN SODIUM 20 MG PO TABS: 20.0000 mg | ORAL_TABLET | Freq: Every day | ORAL | 3 refills | Status: DC

## 2020-08-14 MED ORDER — AMLODIPINE BESYLATE 5 MG PO TABS: 5.0000 mg | ORAL_TABLET | Freq: Every day | ORAL | 3 refills | Status: DC

## 2020-08-14 MED ORDER — LEVOTHYROXINE SODIUM 175 MCG PO TABS
175.0000 ug | ORAL_TABLET | Freq: Every day | ORAL | 1 refills | Status: DC
Start: 2020-08-14 — End: 2021-04-17

## 2020-08-14 MED ORDER — LISINOPRIL-HYDROCHLOROTHIAZIDE 20-25 MG PO TABS: 1.0000 | ORAL_TABLET | Freq: Every day | ORAL | 1 refills | Status: DC

## 2020-08-14 MED ORDER — METFORMIN HCL 500 MG PO TABS
500.0000 mg | ORAL_TABLET | Freq: Every day | ORAL | 1 refills | Status: DC
Start: 2020-08-14 — End: 2020-09-11
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
## 2020-08-14 NOTE — Assessment & Plan Note (Addendum)
BP not at goal today, adding Amlodipine 5 mg, f/u 1 month.

## 2020-08-14 NOTE — Assessment & Plan Note (Signed)
Patient is currently taking 500 mg 3 Q AM. Now he will take 2 tabs bid and fu 1 month.

## 2020-08-18 LAB — COMPREHENSIVE METABOLIC PANEL
AG Ratio: 1.5 (calc) (ref 1.0–2.5)
ALT: 17 U/L (ref 9–46)
AST: 13 U/L (ref 10–35)
Albumin: 4.4 g/dL (ref 3.6–5.1)
Alkaline phosphatase (APISO): 63 U/L (ref 35–144)
BUN: 13 mg/dL (ref 7–25)
CO2: 24 mmol/L (ref 20–32)
Calcium: 9.5 mg/dL (ref 8.6–10.3)
Chloride: 90 mmol/L — ABNORMAL LOW (ref 98–110)
Creat: 1.08 mg/dL (ref 0.70–1.25)
Globulin: 2.9 g/dL (calc) (ref 1.9–3.7)
Glucose, Bld: 92 mg/dL (ref 65–99)
Potassium: 7.1 mmol/L (ref 3.5–5.3)
Sodium: 135 mmol/L (ref 135–146)
Total Bilirubin: 0.9 mg/dL (ref 0.2–1.2)
Total Protein: 7.3 g/dL (ref 6.1–8.1)

## 2020-08-18 LAB — LIPID PANEL
Cholesterol: 210 mg/dL — ABNORMAL HIGH (ref <200)
HDL: 60 mg/dL (ref >=40)
LDL Cholesterol (Calc): 125 mg/dL (calc) — ABNORMAL HIGH
Non-HDL Cholesterol (Calc): 150 mg/dL (calc) — ABNORMAL HIGH (ref <130)
Total CHOL/HDL Ratio: 3.5 (calc) (ref <5.0)
Triglycerides: 131 mg/dL (ref <150)

## 2020-08-18 LAB — TSH: TSH: 0.47 mIU/L (ref 0.40–4.50)

## 2020-08-18 LAB — EXTRA LAV TOP TUBE

## 2020-08-21 ENCOUNTER — Other Ambulatory Visit: Payer: Self-pay | Admitting: *Deleted

## 2020-08-21 ENCOUNTER — Other Ambulatory Visit
Admission: RE | Admit: 2020-08-21 | Discharge: 2020-08-21 | Disposition: A | Discharge status: Home / Self Care | Payer: Self-pay | Source: Ambulatory Visit | Attending: Family Medicine | Admitting: Family Medicine

## 2020-08-21 DIAGNOSIS — E875 Hyperkalemia: Secondary | ICD-10-CM

## 2020-08-21 LAB — COMPREHENSIVE METABOLIC PANEL
ALT: 18 U/L (ref 0–44)
AST: 12 U/L — ABNORMAL LOW (ref 15–41)
Albumin: 4.2 g/dL (ref 3.5–5.0)
Alkaline Phosphatase: 60 U/L (ref 38–126)
Anion gap: 11 (ref 5–15)
BUN: 9 mg/dL (ref 8–23)
CO2: 24 mmol/L (ref 22–32)
Calcium: 9.1 mg/dL (ref 8.9–10.3)
Chloride: 94 mmol/L — ABNORMAL LOW (ref 98–111)
Creatinine, Ser: 0.77 mg/dL (ref 0.61–1.24)
GFR, Estimated: >60 mL/min (ref >60)
Glucose, Bld: 163 mg/dL — ABNORMAL HIGH (ref 70–99)
Potassium: 3.4 mmol/L — ABNORMAL LOW (ref 3.5–5.1)
Sodium: 129 mmol/L — ABNORMAL LOW (ref 135–145)
Total Bilirubin: 0.8 mg/dL (ref 0.3–1.2)
Total Protein: 7.1 g/dL (ref 6.5–8.1)

## 2020-09-11 ENCOUNTER — Ambulatory Visit (INDEPENDENT_AMBULATORY_CARE_PROVIDER_SITE_OTHER): Payer: Self-pay | Admitting: Family Medicine

## 2020-09-11 ENCOUNTER — Other Ambulatory Visit: Payer: Self-pay

## 2020-09-11 ENCOUNTER — Encounter: Payer: Self-pay | Admitting: Family Medicine

## 2020-09-11 VITALS — BP 144/87 | HR 84 | Ht 72.0 in | Wt 254.8 lb

## 2020-09-11 DIAGNOSIS — E119 Type 2 diabetes mellitus without complications: Secondary | ICD-10-CM

## 2020-09-11 DIAGNOSIS — I10 Essential (primary) hypertension: Secondary | ICD-10-CM

## 2020-09-11 LAB — GLUCOSE, POCT (MANUAL RESULT ENTRY): POC Glucose: 189 mg/dl — AB (ref 70–99)

## 2020-09-11 MED ORDER — GLIPIZIDE 5 MG PO TABS
5.0000 mg | ORAL_TABLET | Freq: Two times a day (BID) | ORAL | 3 refills | Status: DC
Start: 2020-09-11 — End: 2021-01-19

## 2020-09-11 MED ORDER — METFORMIN HCL 1000 MG PO TABS: ORAL_TABLET | ORAL | 3 refills | Status: DC

## 2020-09-11 NOTE — Assessment & Plan Note (Signed)
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   Plan- Discussed diet, also increased Metformin and added Glipizide.

## 2020-09-11 NOTE — Progress Notes (Signed)
Established Patient Office Visit  SUBJECTIVE:  Subjective  Patient ID: Blake Williamson, male    DOB: 1956-02-22  Age: 65 y.o. MRN: [id number]  CC:  Chief Complaint  Patient presents with  . Follow-up    Patient is here for a 1 month follow up on diabetes and hypertension.    HPI Blake Williamson is a 65 y.o. male presenting today for     Past Medical History:  Diagnosis Date  . Anxiety   . Depression   . Diabetes mellitus without complication (HCC)   . Hypertension   . Hypothyroidism   . Thyroid disease     Past Surgical History:  Procedure Laterality Date  . COSMETIC SURGERY  1997   from electricution  . HEMORRHOID SURGERY N/A 08/03/2017   Procedure: HEMORRHOIDECTOMY, INTERNAL AND EXTERNAL;  Surgeon: Nadeen Landau, MD;  Location: [facility] ORS;  Service: General;  Laterality: N/A;    Family History  Problem Relation Age of Onset  . Stroke Mother   . Pneumonia Father     Social History   Socioeconomic History  . Marital status: Married    Spouse name: Not on file  . Number of children: Not on file  . Years of education: Not on file  . Highest education level: Not on file  Occupational History  . Not on file  Tobacco Use  . Smoking status: Current Every Day Smoker    Packs/day: 1.00    Types: Cigarettes  . Smokeless tobacco: Never Used  Vaping Use  . Vaping Use: Never used  Substance and Sexual Activity  . Alcohol use: Yes    Alcohol/week: 20.0 standard drinks    Types: 20 Cans of beer per week  . Drug use: Yes    Types: Marijuana  . Sexual activity: Yes  Other Topics Concern  . Not on file  Social History Narrative  . Not on file   Social Determinants of Health   Financial Resource Strain: Not on file  Food Insecurity: Not on file  Transportation Needs: Not on file  Physical Activity: Not on file  Stress: Not on file  Social Connections: Not on file  Intimate Partner Violence: Not on file     Current Outpatient Medications:  .   glipiZIDE (GLUCOTROL) 5 MG tablet, Take 1 tablet (5 mg total) by mouth 2 (two) times daily before a meal., Disp: 60 tablet, Rfl: 3 .  amLODipine (NORVASC) 5 MG tablet, Take 1 tablet (5 mg total) by mouth daily., Disp: 90 tablet, Rfl: 3 .  levothyroxine (SYNTHROID) 175 MCG tablet, Take 1 tablet (175 mcg total) by mouth daily before breakfast., Disp: 90 tablet, Rfl: 1 .  lisinopril-hydrochlorothiazide (ZESTORETIC) 20-25 MG tablet, Take 1 tablet by mouth daily., Disp: 90 tablet, Rfl: 1 .  metFORMIN (GLUCOPHAGE) 1000 MG tablet, Take 1 tab bid with meals, Disp: 60 tablet, Rfl: 3 .  pravastatin (PRAVACHOL) 20 MG tablet, Take 20 mg by mouth daily at 2 PM., Disp: , Rfl:  .  pravastatin (PRAVACHOL) 20 MG tablet, Take 1 tablet (20 mg total) by mouth daily., Disp: 90 tablet, Rfl: 3   No Known Allergies  ROS Review of Systems  Constitutional: Negative.   HENT: Negative.   Respiratory: Negative.   Cardiovascular: Negative.   Gastrointestinal: Negative.   Genitourinary: Negative.   Musculoskeletal: Negative.   Neurological: Negative for numbness.  Psychiatric/Behavioral: Negative.      OBJECTIVE:    Physical Exam Constitutional:      Appearance:  Normal appearance.  HENT:     Head: Normocephalic.     Mouth/Throat:     Mouth: Mucous membranes are moist.  Cardiovascular:     Rate and Rhythm: Normal rate and regular rhythm.  Pulmonary:     Effort: Pulmonary effort is normal.  Abdominal:     General: Abdomen is flat.  Musculoskeletal:        General: Normal range of motion.  Neurological:     Mental Status: He is alert.  Psychiatric:        Mood and Affect: Mood normal.     BP (!) 144/87   Pulse 84   Ht 6' (1.829 m)   Wt 254 lb 12.8 oz (115.6 kg)   BMI 34.56 kg/m  Wt Readings from Last 3 Encounters:  09/11/20 254 lb 12.8 oz (115.6 kg)  08/14/20 262 lb 11.2 oz (119.2 kg)  07/28/17 249 lb (112.9 kg)    Health Maintenance Due  Topic Date Due  . Hepatitis C Screening  Never done   . PNEUMOCOCCAL POLYSACCHARIDE VACCINE AGE 24-64 HIGH RISK  Never done  . COVID-19 Vaccine (1) Never done  . FOOT EXAM  Never done  . OPHTHALMOLOGY EXAM  Never done  . HIV Screening  Never done  . COLONOSCOPY (Pts 45-64yrs Insurance coverage will need to be confirmed)  Never done    There are no preventive care reminders to display for this patient.  CBC Latest Ref Rng & Units 07/28/2017  WBC 3.8 - 10.6 K/uL 10.9(H)  Hemoglobin 13.0 - 18.0 g/dL 51.0  Hematocrit 25.8 - 52.0 % 47.6  Platelets 150 - 440 K/uL 379   CMP Latest Ref Rng & Units 08/21/2020 08/14/2020 07/28/2017  Glucose 70 - 99 mg/dL 527(P) 92 824(M)  BUN 8 - 23 mg/dL 9 13 16   Creatinine 0.61 - 1.24 mg/dL 3.53 6.14  Sodium 135 - 145 mmol/L 129(L) 135 136  Potassium 3.5 - 5.1 mmol/L 3.4(L) 7.1(HH) 3.8  Chloride 98 - 111 mmol/L 94(L) 90(L) 99(L)  CO2 22 - 32 mmol/L 24 24 28   Calcium 8.9 - 10.3 mg/dL 9.1 9.5 9.3  Total Protein 6.5 - 8.1 g/dL 7.1 7.3 7.0  Total Bilirubin 0.3 - 1.2 mg/dL 0.8 0.9 0.6  Alkaline Phos 38 - 126 U/L 60 - 71  AST 15 - 41 U/L 12(L) 13 16  ALT 0 - 44 U/L 18 17 19     Lab Results  Component Value Date   TSH 0.47 08/14/2020   Lab Results  Component Value Date   ALBUMIN 4.2 08/21/2020   ANIONGAP 11 08/21/2020   Lab Results  Component Value Date   CHOL 210 (H) 08/14/2020   HDL 60 08/14/2020   LDLCALC 125 (H) 08/14/2020   CHOLHDL 3.5 08/14/2020   Lab Results  Component Value Date   TRIG 131 08/14/2020   Lab Results  Component Value Date   HGBA1C 7.6 (A) 08/14/2020      ASSESSMENT & PLAN:   Problem List Items Addressed This Visit      Cardiovascular and Mediastinum   Primary hypertension    Patient's blood pressure is not within the desired range.  An office visit is recommended. Medication side effects include: no side effects noted Continue current treatment regimen.   BP borderline, continue meds, decrease salt intake.          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   Plan- Discussed diet, also increased Metformin and added Glipizide.       Relevant Medications   metFORMIN (GLUCOPHAGE) 1000 MG tablet   glipiZIDE (GLUCOTROL) 5 MG tablet   Other Relevant Orders   POCT glucose (manual entry) (Completed)      Meds ordered this encounter  Medications  . metFORMIN (GLUCOPHAGE) 1000 MG tablet    Sig: Take 1 tab bid with meals    Dispense:  60 tablet    Refill:  3  . glipiZIDE (GLUCOTROL) 5 MG tablet    Sig: Take 1 tablet (5 mg total) by mouth 2 (two) times daily before a meal.    Dispense:  60 tablet    Refill:  3      Follow-up: No follow-ups on file.    Irish Lack, FNP [facility] [address] [place], [place] [address]

## 2020-09-11 NOTE — Assessment & Plan Note (Signed)
Patient's blood pressure is not within the desired range.  An office visit is recommended. Medication side effects include: no side effects noted Continue current treatment regimen.   BP borderline, continue meds, decrease salt intake.

## 2020-11-11 ENCOUNTER — Ambulatory Visit: Payer: Self-pay | Admitting: Internal Medicine

## 2021-01-19 ENCOUNTER — Other Ambulatory Visit: Payer: Self-pay | Admitting: *Deleted

## 2021-01-19 MED ORDER — GLIPIZIDE 5 MG PO TABS: 5.0000 mg | ORAL_TABLET | Freq: Two times a day (BID) | ORAL | 3 refills | Status: DC

## 2021-02-08 ENCOUNTER — Ambulatory Visit: Payer: Self-pay | Admitting: Internal Medicine

## 2021-02-16 ENCOUNTER — Other Ambulatory Visit: Payer: Self-pay

## 2021-02-16 ENCOUNTER — Other Ambulatory Visit: Payer: Self-pay | Admitting: Internal Medicine

## 2021-02-16 MED ORDER — PRAVASTATIN SODIUM 20 MG PO TABS: 20.0000 mg | ORAL_TABLET | Freq: Every day | ORAL | 1 refills | Status: DC

## 2021-02-16 MED ORDER — AMLODIPINE BESYLATE 5 MG PO TABS: 5.0000 mg | ORAL_TABLET | Freq: Every day | ORAL | 1 refills | Status: DC

## 2021-03-15 ENCOUNTER — Encounter: Payer: Self-pay | Admitting: Internal Medicine

## 2021-03-15 ENCOUNTER — Ambulatory Visit (INDEPENDENT_AMBULATORY_CARE_PROVIDER_SITE_OTHER): Payer: Self-pay | Admitting: Internal Medicine

## 2021-03-15 ENCOUNTER — Other Ambulatory Visit: Payer: Self-pay

## 2021-03-15 VITALS — BP 161/90 | HR 71 | Ht 72.0 in | Wt 272.5 lb

## 2021-03-15 DIAGNOSIS — E669 Obesity, unspecified: Secondary | ICD-10-CM | POA: Insufficient documentation

## 2021-03-15 DIAGNOSIS — E119 Type 2 diabetes mellitus without complications: Secondary | ICD-10-CM

## 2021-03-15 DIAGNOSIS — F17200 Nicotine dependence, unspecified, uncomplicated: Secondary | ICD-10-CM

## 2021-03-15 DIAGNOSIS — Z6837 Body mass index (BMI) 37.0-37.9, adult: Secondary | ICD-10-CM

## 2021-03-15 DIAGNOSIS — I10 Essential (primary) hypertension: Secondary | ICD-10-CM

## 2021-03-15 LAB — GLUCOSE, POCT (MANUAL RESULT ENTRY): POC Glucose: 123 mg/dl — AB (ref 70–99)

## 2021-03-15 LAB — POCT GLYCOSYLATED HEMOGLOBIN (HGB A1C): HbA1c POC (<> result, manual entry): 4.8 % (ref 4.0–5.6)

## 2021-03-15 NOTE — Assessment & Plan Note (Signed)

## 2021-03-15 NOTE — Assessment & Plan Note (Signed)
-   I instructed the patient to stop smoking and provided them with smoking cessation materials.  - I informed the patient that smoking puts them at increased risk for cancer, COPD, hypertension, and more.  - Informed the patient to seek help if they begin to have trouble breathing, develop chest pain, start to cough up blood, feel faint, or pass out.  

## 2021-03-15 NOTE — Progress Notes (Signed)
Established Patient Office Visit  Subjective:  Patient ID: Blake Williamson, male    DOB: 09-13-1955  Age: 65 y.o. MRN: [id number]  CC:  Chief Complaint  Patient presents with   medications    HPI  Blake Williamson presents for follow-up on the diabetes and high blood pressure, patient has a problem with anxiety hypothyroidism  Past Medical History:  Diagnosis Date   Anxiety    Depression    Diabetes mellitus without complication (HCC)    Hypertension    Hypothyroidism    Thyroid disease     Past Surgical History:  Procedure Laterality Date   COSMETIC SURGERY  1997   from electricution   HEMORRHOID SURGERY N/A 08/03/2017   Procedure: HEMORRHOIDECTOMY, INTERNAL AND EXTERNAL;  Surgeon: Nadeen Landau, MD;  Location: [facility] ORS;  Service: General;  Laterality: N/A;    Family History  Problem Relation Age of Onset   Stroke Mother    Pneumonia Father     Social History   Socioeconomic History   Marital status: Married    Spouse name: Not on file   Number of children: Not on file   Years of education: Not on file   Highest education level: Not on file  Occupational History   Not on file  Tobacco Use   Smoking status: Every Day    Packs/day: 1.00    Types: Cigarettes   Smokeless tobacco: Never  Vaping Use   Vaping Use: Never used  Substance and Sexual Activity   Alcohol use: Yes    Alcohol/week: 20.0 standard drinks    Types: 20 Cans of beer per week   Drug use: Yes    Types: Marijuana   Sexual activity: Yes  Other Topics Concern   Not on file  Social History Narrative   Not on file   Social Determinants of Health   Financial Resource Strain: Not on file  Food Insecurity: Not on file  Transportation Needs: Not on file  Physical Activity: Not on file  Stress: Not on file  Social Connections: Not on file  Intimate Partner Violence: Not on file     Current Outpatient Medications:    amLODipine (NORVASC) 5 MG tablet, Take 1 tablet (5 mg  total) by mouth daily., Disp: 90 tablet, Rfl: 1   glipiZIDE (GLUCOTROL) 5 MG tablet, Take 1 tablet (5 mg total) by mouth 2 (two) times daily before a meal., Disp: 60 tablet, Rfl: 3   levothyroxine (SYNTHROID) 175 MCG tablet, Take 1 tablet (175 mcg total) by mouth daily before breakfast., Disp: 90 tablet, Rfl: 1   lisinopril-hydrochlorothiazide (ZESTORETIC) 20-25 MG tablet, Take 1 tablet by mouth daily., Disp: 90 tablet, Rfl: 1   metFORMIN (GLUCOPHAGE) 1000 MG tablet, Take 1 tab bid with meals, Disp: 60 tablet, Rfl: 3   metFORMIN (GLUCOPHAGE) 500 MG tablet, Take 1 tablet by mouth once daily with breakfast, Disp: 90 tablet, Rfl: 0   pravastatin (PRAVACHOL) 20 MG tablet, Take 1 tablet (20 mg total) by mouth daily at 2 PM., Disp: 90 tablet, Rfl: 1   No Known Allergies  ROS Review of Systems  Constitutional: Negative.   HENT: Negative.    Eyes: Negative.   Respiratory: Negative.    Cardiovascular: Negative.   Gastrointestinal: Negative.   Endocrine: Negative.   Genitourinary: Negative.   Musculoskeletal: Negative.   Skin: Negative.   Allergic/Immunologic: Negative.   Neurological: Negative.   Hematological: Negative.   Psychiatric/Behavioral: Negative.    All other systems reviewed  and are negative.    Objective:    Physical Exam Vitals reviewed.  Constitutional:      Appearance: Normal appearance.  HENT:     Mouth/Throat:     Mouth: Mucous membranes are moist.  Eyes:     Pupils: Pupils are equal, round, and reactive to light.  Neck:     Vascular: No carotid bruit.  Cardiovascular:     Rate and Rhythm: Normal rate and regular rhythm.     Pulses: Normal pulses.     Heart sounds: Normal heart sounds.  Pulmonary:     Effort: Pulmonary effort is normal.     Breath sounds: Normal breath sounds.  Abdominal:     General: Bowel sounds are normal.     Palpations: Abdomen is soft. There is no hepatomegaly, splenomegaly or mass.     Tenderness: There is no abdominal tenderness.      Hernia: No hernia is present.  Musculoskeletal:     Cervical back: Neck supple.     Right lower leg: No edema.     Left lower leg: No edema.  Skin:    Findings: No rash.  Neurological:     Mental Status: He is alert and oriented to person, place, and time.     Motor: No weakness.  Psychiatric:        Mood and Affect: Mood normal.        Behavior: Behavior normal.    BP (!) 161/90   Pulse 71   Ht 6' (1.829 m)   Wt 272 lb 8 oz (123.6 kg)   BMI 36.96 kg/m  Wt Readings from Last 3 Encounters:  03/15/21 272 lb 8 oz (123.6 kg)  09/11/20 254 lb 12.8 oz (115.6 kg)  08/14/20 262 lb 11.2 oz (119.2 kg)     Health Maintenance Due  Topic Date Due   FOOT EXAM  Never done   OPHTHALMOLOGY EXAM  Never done   HIV Screening  Never done   Hepatitis C Screening  Never done   COLONOSCOPY (Pts 45-30yrs Insurance coverage will need to be confirmed)  Never done   Zoster Vaccines- Shingrix (1 of 2) Never done   COVID-19 Vaccine (3 - Booster for Pfizer series) 07/09/2020   INFLUENZA VACCINE  Never done    There are no preventive care reminders to display for this patient.  Lab Results  Component Value Date   TSH 0.47 08/14/2020   Lab Results  Component Value Date   WBC 10.9 (H) 07/28/2017   HGB 16.0 07/28/2017   HCT 47.6 07/28/2017   MCV 88.8 07/28/2017   PLT 379 07/28/2017   Lab Results  Component Value Date   NA 129 (L) 08/21/2020   K 3.4 (L) 08/21/2020   CO2 24 08/21/2020   GLUCOSE 163 (H) 08/21/2020   BUN 9 08/21/2020   CREATININE 0.77 08/21/2020   BILITOT 0.8 08/21/2020   ALKPHOS 60 08/21/2020   AST 12 (L) 08/21/2020   ALT 18 08/21/2020   PROT 7.1 08/21/2020   ALBUMIN 4.2 08/21/2020   CALCIUM 9.1 08/21/2020   ANIONGAP 11 08/21/2020   Lab Results  Component Value Date   CHOL 210 (H) 08/14/2020   Lab Results  Component Value Date   HDL 60 08/14/2020   Lab Results  Component Value Date   LDLCALC 125 (H) 08/14/2020   Lab Results  Component Value Date    TRIG 131 08/14/2020   Lab Results  Component Value Date   CHOLHDL 3.5 08/14/2020  Lab Results  Component Value Date   HGBA1C 4.8 03/15/2021      Assessment & Plan:   Problem List Items Addressed This Visit       Cardiovascular and Mediastinum   Primary hypertension     Patient denies any chest pain or shortness of breath there is no history of palpitation or paroxysmal nocturnal dyspnea   patient was advised to follow low-salt low-cholesterol diet    ideally I want to keep systolic blood pressure below 683 mmHg, patient was asked to check blood pressure one times a week and give me a report on that.  Patient will be follow-up in 3 months  or earlier as needed, patient will call me back for any change in the cardiovascular symptoms Patient was advised to buy a book from local bookstore concerning blood pressure and read several chapters  every day.  This will be supplemented by some of the material we will give him from the office.  Patient should also utilize other resources like YouTube and Internet to learn more about the blood pressure and the diet.        Endocrine   Type 2 diabetes mellitus without complication, without long-term current use of insulin (HCC) - Primary    - The patient's blood sugar is labile on med. - The patient will continue the current treatment regimen.  - I encouraged the patient to regularly check blood sugar.  - I encouraged the patient to monitor diet. I encouraged the patient to eat low-carb and low-sugar to help prevent blood sugar spikes.  - I encouraged the patient to continue following their prescribed treatment plan for diabetes - I informed the patient to get help if blood sugar drops below 54mg /dL, or if suddenly have trouble thinking clearly or breathing.  Patient was advised to buy a book on diabetes from a local bookstore or from .  Patient should read 2 chapters every day to keep the motivation going, this is in addition to some of  the materials we provided them from the office.  There are other resources on the Internet like YouTube and wilkipedia to get an education on the diabetes      Relevant Orders   POCT HgB A1C (Completed)   POCT glucose (manual entry) (Completed)     Other   Class 2 obesity without serious comorbidity with body mass index (BMI) of 37.0 to 37.9 in adult    - I encouraged the patient to lose weight.  - I educated them on making healthy dietary choices including eating more fruits and vegetables and less fried foods. - I encouraged the patient to exercise more, and educated on the benefits of exercise including weight loss, diabetes prevention, and hypertension prevention.   Dietary counseling with a registered dietician  Referral to a weight management support group (e.g. Weight Watchers, Overeaters Anonymous)  If your BMI is greater than 29 or you have gained more than 15 pounds you should work on weight loss.  Attend a healthy cooking class       Smoker    - I instructed the patient to stop smoking and provided them with smoking cessation materials.  - I informed the patient that smoking puts them at increased risk for cancer, COPD, hypertension, and more.  - Informed the patient to seek help if they begin to have trouble breathing, develop chest pain, start to cough up blood, feel faint, or pass out.       No orders of  the defined types were placed in this encounter.   Follow-up: No follow-ups on file.    Corky Downs, MD

## 2021-03-15 NOTE — Assessment & Plan Note (Signed)

## 2021-03-15 NOTE — Assessment & Plan Note (Signed)

## 2021-04-16 ENCOUNTER — Other Ambulatory Visit: Payer: Self-pay | Admitting: Internal Medicine

## 2021-06-02 ENCOUNTER — Other Ambulatory Visit: Payer: Self-pay | Admitting: *Deleted

## 2021-06-02 MED ORDER — METFORMIN HCL 1000 MG PO TABS: ORAL_TABLET | ORAL | 0 refills | Status: AC

## 2021-06-15 ENCOUNTER — Ambulatory Visit: Payer: Self-pay | Admitting: Internal Medicine

## 2021-07-13 ENCOUNTER — Other Ambulatory Visit: Payer: Self-pay | Admitting: *Deleted

## 2021-07-13 MED ORDER — LEVOTHYROXINE SODIUM 175 MCG PO TABS: 175.0000 ug | ORAL_TABLET | Freq: Every day | ORAL | 2 refills | Status: DC

## 2021-07-19 ENCOUNTER — Other Ambulatory Visit: Payer: Self-pay | Admitting: *Deleted

## 2021-07-19 MED ORDER — LEVOTHYROXINE SODIUM 175 MCG PO TABS: 175.0000 ug | ORAL_TABLET | Freq: Every day | ORAL | 2 refills | Status: DC

## 2021-07-20 ENCOUNTER — Ambulatory Visit: Payer: Self-pay | Admitting: Internal Medicine

## 2021-08-13 ENCOUNTER — Other Ambulatory Visit: Payer: Self-pay

## 2021-08-13 ENCOUNTER — Ambulatory Visit (INDEPENDENT_AMBULATORY_CARE_PROVIDER_SITE_OTHER): Payer: Medicare Other | Admitting: Nurse Practitioner

## 2021-08-13 ENCOUNTER — Encounter: Payer: Self-pay | Admitting: Nurse Practitioner

## 2021-08-13 VITALS — BP 140/90 | HR 80 | Ht 72.0 in | Wt 261.4 lb

## 2021-08-13 DIAGNOSIS — E119 Type 2 diabetes mellitus without complications: Secondary | ICD-10-CM

## 2021-08-13 DIAGNOSIS — I10 Essential (primary) hypertension: Secondary | ICD-10-CM | POA: Diagnosis not present

## 2021-08-13 DIAGNOSIS — F17200 Nicotine dependence, unspecified, uncomplicated: Secondary | ICD-10-CM

## 2021-08-13 DIAGNOSIS — Z6837 Body mass index (BMI) 37.0-37.9, adult: Secondary | ICD-10-CM

## 2021-08-13 DIAGNOSIS — E039 Hypothyroidism, unspecified: Secondary | ICD-10-CM | POA: Diagnosis not present

## 2021-08-13 DIAGNOSIS — E669 Obesity, unspecified: Secondary | ICD-10-CM

## 2021-08-13 DIAGNOSIS — Z114 Encounter for screening for human immunodeficiency virus [HIV]: Secondary | ICD-10-CM

## 2021-08-13 DIAGNOSIS — Z1159 Encounter for screening for other viral diseases: Secondary | ICD-10-CM

## 2021-08-13 LAB — GLUCOSE, POCT (MANUAL RESULT ENTRY): POC Glucose: 121 mg/dl — AB (ref 70–99)

## 2021-08-13 NOTE — Assessment & Plan Note (Addendum)
BMI 35.45 ?Advise pt to lose weight. ?Watch diet and follow a routine physical activity schedule. ?

## 2021-08-13 NOTE — Assessment & Plan Note (Addendum)
Patient smokes half a pack a day. ?Encouraged pt to stop smoking and discussed the options for quitting.  ? ?

## 2021-08-13 NOTE — Assessment & Plan Note (Addendum)
Patient BP 140/90 in the office today. ?Advised pt to follow a low sodium and heart healthy diet. ?Continue lisinopril-HCTZ and amlodipine. ? ? ?

## 2021-08-13 NOTE — Assessment & Plan Note (Addendum)
Patient BS 121 in the office today. ?Advised pt to check the BS regularly, make a log and bring to next appointment.  ?Advised pt to monitor diet. ?Advised pt to eat variety of food including fruits, vegetables, whole grains, complex carbohydrates and proteins. ?Encouraged pt to get a foot and eye examination, referral sent.    ?Continue metformin and glipizide. ? ?

## 2021-08-13 NOTE — Progress Notes (Signed)
Established Patient Office Visit  Subjective:  Patient ID: Blake Williamson, male    DOB: 1955/08/20  Age: 66 y.o. MRN: [id number]  CC:  Chief Complaint  Patient presents with   Diabetes    3 month follow up     HPI  Blake Williamson presents for follow up. Overall he is doing well. He does not checks BS at home.   HPI   Past Medical History:  Diagnosis Date   Anxiety    Depression    Diabetes mellitus without complication (HCC)    Hypertension    Hypothyroidism    Thyroid disease     Past Surgical History:  Procedure Laterality Date   COSMETIC SURGERY  1997   from electricution   HEMORRHOID SURGERY N/A 08/03/2017   Procedure: HEMORRHOIDECTOMY, INTERNAL AND EXTERNAL;  Surgeon: Blake Landau, MD;  Location: [facility] ORS;  Service: General;  Laterality: N/A;    Family History  Problem Relation Age of Onset   Stroke Mother    Pneumonia Father     Social History   Socioeconomic History   Marital status: Married    Spouse name: Not on file   Number of children: Not on file   Years of education: Not on file   Highest education level: Not on file  Occupational History   Not on file  Tobacco Use   Smoking status: Every Day    Packs/day: 0.50    Types: Cigarettes   Smokeless tobacco: Never  Vaping Use   Vaping Use: Never used  Substance and Sexual Activity   Alcohol use: Yes    Alcohol/week: 20.0 standard drinks    Types: 20 Cans of beer per week   Drug use: Yes    Types: Marijuana   Sexual activity: Yes  Other Topics Concern   Not on file  Social History Narrative   Not on file   Social Determinants of Health   Financial Resource Strain: Not on file  Food Insecurity: Not on file  Transportation Needs: Not on file  Physical Activity: Not on file  Stress: Not on file  Social Connections: Not on file  Intimate Partner Violence: Not on file     Outpatient Medications Prior to Visit  Medication Sig Dispense Refill   amLODipine (NORVASC) 5  MG tablet Take 1 tablet (5 mg total) by mouth daily. 90 tablet 1   glipiZIDE (GLUCOTROL) 5 MG tablet Take 1 tablet (5 mg total) by mouth 2 (two) times daily before a meal. 60 tablet 3   levothyroxine (SYNTHROID) 175 MCG tablet Take 1 tablet (175 mcg total) by mouth daily before breakfast. 90 tablet 2   lisinopril-hydrochlorothiazide (ZESTORETIC) 20-25 MG tablet Take 1 tablet by mouth daily. 90 tablet 1   metFORMIN (GLUCOPHAGE) 1000 MG tablet Take 1 tab bid with meals 60 tablet 0   pravastatin (PRAVACHOL) 20 MG tablet Take 1 tablet (20 mg total) by mouth daily at 2 PM. 90 tablet 1   No facility-administered medications prior to visit.    No Known Allergies  ROS Review of Systems  Constitutional:  Negative for activity change and appetite change.  HENT:  Negative for congestion and sinus pain.   Eyes:  Negative for pain and discharge.  Respiratory:  Negative for cough and shortness of breath.   Cardiovascular:  Negative for chest pain and leg swelling.  Gastrointestinal:  Negative for abdominal distention and blood in stool.  Endocrine: Negative for cold intolerance and heat intolerance.  Genitourinary:  Negative for difficulty urinating and frequency.  Musculoskeletal: Negative.   Skin:  Negative for color change and pallor.  Neurological:  Negative for dizziness, light-headedness and numbness.  Psychiatric/Behavioral:  Negative for agitation, behavioral problems and confusion.      Objective:    Physical Exam Constitutional:      Appearance: Normal appearance. He is obese.  HENT:     Head: Normocephalic.     Right Ear: Tympanic membrane normal.     Left Ear: Tympanic membrane normal.     Nose: Nose normal.  Eyes:     Pupils: Pupils are equal, round, and reactive to light.  Cardiovascular:     Rate and Rhythm: Normal rate.     Heart sounds: Murmur heard.  Pulmonary:     Effort: Pulmonary effort is normal.     Breath sounds: Normal breath sounds.  Abdominal:     General:  Bowel sounds are normal.     Palpations: Abdomen is soft.  Musculoskeletal:        General: Normal range of motion.     Cervical back: Normal range of motion.  Skin:    General: Skin is warm.     Capillary Refill: Capillary refill takes less than 2 seconds.  Neurological:     General: No focal deficit present.     Mental Status: He is alert and oriented to person, place, and time. Mental status is at baseline.  Psychiatric:        Mood and Affect: Mood normal.        Behavior: Behavior normal.        Thought Content: Thought content normal.        Judgment: Judgment normal.    BP 140/90    Pulse 80    Ht 6' (1.829 m)    Wt 261 lb 6.4 oz (118.6 kg)    BMI 35.45 kg/m  Wt Readings from Last 3 Encounters:  08/13/21 261 lb 6.4 oz (118.6 kg)  03/15/21 272 lb 8 oz (123.6 kg)  09/11/20 254 lb 12.8 oz (115.6 kg)     Health Maintenance Due  Topic Date Due   FOOT EXAM  Never done   OPHTHALMOLOGY EXAM  Never done   Hepatitis C Screening  Never done   COLONOSCOPY (Pts 45-73yrs Insurance coverage will need to be confirmed)  Never done   Zoster Vaccines- Shingrix (1 of 2) Never done   COVID-19 Vaccine (3 - Booster for Pfizer series) 04/03/2020   INFLUENZA VACCINE  Never done    There are no preventive care reminders to display for this patient.  Lab Results  Component Value Date   TSH 0.47 08/14/2020   Lab Results  Component Value Date   WBC 10.9 (H) 07/28/2017   HGB 16.0 07/28/2017   HCT 47.6 07/28/2017   MCV 88.8 07/28/2017   PLT 379 07/28/2017   Lab Results  Component Value Date   NA 129 (L) 08/21/2020   K 3.4 (L) 08/21/2020   CO2 24 08/21/2020   GLUCOSE 163 (H) 08/21/2020   BUN 9 08/21/2020   CREATININE 0.77 08/21/2020   BILITOT 0.8 08/21/2020   ALKPHOS 60 08/21/2020   AST 12 (L) 08/21/2020   ALT 18 08/21/2020   PROT 7.1 08/21/2020   ALBUMIN 4.2 08/21/2020   CALCIUM 9.1 08/21/2020   ANIONGAP 11 08/21/2020   Lab Results  Component Value Date   CHOL 210 (H)  08/14/2020   Lab Results  Component Value Date   HDL  60 08/14/2020   Lab Results  Component Value Date   LDLCALC 125 (H) 08/14/2020   Lab Results  Component Value Date   TRIG 131 08/14/2020   Lab Results  Component Value Date   CHOLHDL 3.5 08/14/2020   Lab Results  Component Value Date   HGBA1C 4.8 03/15/2021      Assessment & Plan:   Problem List Items Addressed This Visit       Cardiovascular and Mediastinum   Primary hypertension - Primary    Patient BP 140/90 in the office today. Advised pt to follow a low sodium and heart healthy diet. Continue lisinopril-HCTZ and amlodipine.        Relevant Orders   CBC with Differential/Platelet   Lipid panel   COMPLETE METABOLIC PANEL WITH GFR     Endocrine   Type 2 diabetes mellitus without complication, without long-term current use of insulin (HCC)    Patient BS 121 in the office today. Advised pt to check the BS regularly, make a log and bring to next appointment.  Advised pt to monitor diet. Advised pt to eat variety of food including fruits, vegetables, whole grains, complex carbohydrates and proteins. Encouraged pt to get a foot and eye examination, referral sent.    Continue metformin and glipizide.       Relevant Orders   Hemoglobin A1c   Microalbumin, urine   POCT glucose (manual entry)   Ambulatory referral to Ophthalmology   Ambulatory referral to Podiatry   Hypothyroidism    Stable at present. Continue levothyroxine 175 mcg. Labs ordered.      Relevant Orders   TSH     Other   Class 2 obesity without serious comorbidity with body mass index (BMI) of 37.0 to 37.9 in adult    BMI 35.45 Advise pt to lose weight. Watch diet and follow a routine physical activity schedule.      Smoker    Patient smokes half a pack a day. Encouraged pt to stop smoking and discussed the options for quitting.        Other Visit Diagnoses     Need for hepatitis C screening test       Relevant Orders    Hepatitis C Antibody   Encounter for screening for HIV       Relevant Orders   HIV antibody (with reflex)        No orders of the defined types were placed in this encounter.    Follow-up: No follow-ups on file.    Kara Dies, NP

## 2021-08-13 NOTE — Assessment & Plan Note (Addendum)
Stable at present. ?Continue levothyroxine 175 mcg. ?Labs ordered. ?

## 2021-08-18 ENCOUNTER — Other Ambulatory Visit: Payer: Self-pay | Admitting: Internal Medicine

## 2021-08-19 ENCOUNTER — Other Ambulatory Visit: Payer: Self-pay | Admitting: *Deleted

## 2021-08-19 MED ORDER — PRAVASTATIN SODIUM 20 MG PO TABS: 20.0000 mg | ORAL_TABLET | Freq: Every day | ORAL | 1 refills | Status: DC

## 2021-08-19 MED ORDER — AMLODIPINE BESYLATE 5 MG PO TABS: 5.0000 mg | ORAL_TABLET | Freq: Every day | ORAL | 1 refills | Status: DC

## 2021-08-23 ENCOUNTER — Other Ambulatory Visit: Payer: Self-pay

## 2021-08-23 ENCOUNTER — Ambulatory Visit: Payer: Medicare Other | Admitting: *Deleted

## 2021-08-23 DIAGNOSIS — E119 Type 2 diabetes mellitus without complications: Secondary | ICD-10-CM

## 2021-08-23 MED ORDER — GLIPIZIDE 5 MG PO TABS: 5.0000 mg | ORAL_TABLET | Freq: Two times a day (BID) | ORAL | 3 refills | Status: DC

## 2021-08-23 MED ORDER — PRAVASTATIN SODIUM 20 MG PO TABS: 20.0000 mg | ORAL_TABLET | Freq: Every day | ORAL | 1 refills | Status: DC

## 2021-08-24 LAB — LIPID PANEL
Cholesterol: 122 mg/dL (ref <200)
HDL: 52 mg/dL (ref >=40)
LDL Cholesterol (Calc): 56 mg/dL (calc)
Non-HDL Cholesterol (Calc): 70 mg/dL (calc) (ref <130)
Total CHOL/HDL Ratio: 2.3 (calc) (ref <5.0)
Triglycerides: 55 mg/dL (ref <150)

## 2021-08-24 LAB — COMPLETE METABOLIC PANEL WITH GFR
AG Ratio: 1.5 (calc) (ref 1.0–2.5)
ALT: 31 U/L (ref 9–46)
AST: 21 U/L (ref 10–35)
Albumin: 4 g/dL (ref 3.6–5.1)
Alkaline phosphatase (APISO): 61 U/L (ref 35–144)
BUN: 12 mg/dL (ref 7–25)
CO2: 25 mmol/L (ref 20–32)
Calcium: 8.9 mg/dL (ref 8.6–10.3)
Chloride: 99 mmol/L (ref 98–110)
Creat: 1.06 mg/dL (ref 0.70–1.35)
Globulin: 2.7 g/dL (calc) (ref 1.9–3.7)
Glucose, Bld: 98 mg/dL (ref 65–99)
Potassium: 4.9 mmol/L (ref 3.5–5.3)
Sodium: 136 mmol/L (ref 135–146)
Total Bilirubin: 0.5 mg/dL (ref 0.2–1.2)
Total Protein: 6.7 g/dL (ref 6.1–8.1)
eGFR: 78 mL/min/{1.73_m2} (ref >=60)

## 2021-08-24 LAB — HEMOGLOBIN A1C
Hgb A1c MFr Bld: 6.2 % of total Hgb — ABNORMAL HIGH (ref <5.7)
Mean Plasma Glucose: 131 mg/dL
eAG (mmol/L): 7.3 mmol/L

## 2021-08-24 LAB — CBC WITH DIFFERENTIAL/PLATELET
Absolute Monocytes: 686 cells/uL (ref 200–950)
Basophils Absolute: 102 cells/uL (ref 0–200)
Basophils Relative: 1.4 %
Eosinophils Absolute: 292 cells/uL (ref 15–500)
Eosinophils Relative: 4 %
HCT: 46.2 % (ref 38.5–50.0)
Hemoglobin: 15.5 g/dL (ref 13.2–17.1)
Lymphs Abs: 2132 cells/uL (ref 850–3900)
MCH: 29.9 pg (ref 27.0–33.0)
MCHC: 33.5 g/dL (ref 32.0–36.0)
MCV: 89 fL (ref 80.0–100.0)
MPV: 10.2 fL (ref 7.5–12.5)
Monocytes Relative: 9.4 %
Neutro Abs: 4088 cells/uL (ref 1500–7800)
Neutrophils Relative %: 56 %
Platelets: 425 10*3/uL — ABNORMAL HIGH (ref 140–400)
RBC: 5.19 10*6/uL (ref 4.20–5.80)
RDW: 12.7 % (ref 11.0–15.0)
Total Lymphocyte: 29.2 %
WBC: 7.3 10*3/uL (ref 3.8–10.8)

## 2021-08-24 LAB — TSH: TSH: 0.58 mIU/L (ref 0.40–4.50)

## 2021-08-24 LAB — SPECIMEN COMPROMISED

## 2021-08-30 ENCOUNTER — Other Ambulatory Visit: Payer: Self-pay | Admitting: Internal Medicine

## 2021-09-29 ENCOUNTER — Other Ambulatory Visit: Payer: Self-pay | Admitting: Internal Medicine

## 2021-10-15 ENCOUNTER — Ambulatory Visit (INDEPENDENT_AMBULATORY_CARE_PROVIDER_SITE_OTHER): Payer: Medicare HMO | Admitting: Nurse Practitioner

## 2021-10-15 ENCOUNTER — Encounter: Payer: Self-pay | Admitting: Nurse Practitioner

## 2021-10-15 ENCOUNTER — Ambulatory Visit: Payer: Medicare HMO | Admitting: Nurse Practitioner

## 2021-10-15 VITALS — BP 138/80 | HR 77 | Ht 72.0 in | Wt 259.6 lb

## 2021-10-15 DIAGNOSIS — Z0000 Encounter for general adult medical examination without abnormal findings: Secondary | ICD-10-CM | POA: Diagnosis not present

## 2021-10-15 DIAGNOSIS — F17200 Nicotine dependence, unspecified, uncomplicated: Secondary | ICD-10-CM | POA: Diagnosis not present

## 2021-10-15 DIAGNOSIS — E119 Type 2 diabetes mellitus without complications: Secondary | ICD-10-CM | POA: Diagnosis not present

## 2021-10-15 DIAGNOSIS — N529 Male erectile dysfunction, unspecified: Secondary | ICD-10-CM | POA: Diagnosis not present

## 2021-10-15 LAB — GLUCOSE, POCT (MANUAL RESULT ENTRY): POC Glucose: 99 mg/dl (ref 70–99)

## 2021-10-15 MED ORDER — NICOTINE 21 MG/24HR TD PT24: 21.0000 mg | MEDICATED_PATCH | Freq: Every day | TRANSDERMAL | 0 refills | Status: AC

## 2021-10-15 MED ORDER — TADALAFIL 10 MG PO TABS: 10.0000 mg | ORAL_TABLET | ORAL | 1 refills | Status: AC | PRN

## 2021-10-15 NOTE — Assessment & Plan Note (Signed)
Started him on tadalafil 10 mg as needed ?

## 2021-10-15 NOTE — Assessment & Plan Note (Addendum)
Patient BS  99  in the office today. ?Advised pt to check the BS regularly, make a log and bring to next appointment.  ?Advised pt to monitor diet. ?Advised pt to eat variety of food including fruits, vegetables, whole grains, complex carbohydrates and proteins.  ? ?

## 2021-10-15 NOTE — Assessment & Plan Note (Signed)
Patient smokes a pack daily. Started him on Nicotine patch 21 mg. ?

## 2021-10-15 NOTE — Progress Notes (Deleted)
? ?Subjective:  ?  ?HPI ? ?Blake Williamson is a 66 y.o. male who presents for an Initial Medicare Annual Wellness Visit. Patient complaints of ED would like to take medication. He also wants to quit smoking and would like to try nicotine patches for smoking cessation.  ? ? ?Review of Systems    ?Review of Systems  ?Constitutional:  Negative for chills, fever and malaise/fatigue.  ?HENT:  Negative for ear pain, hearing loss and sinus pain.   ?Eyes:  Negative for blurred vision, double vision and pain.  ?Respiratory:  Negative for cough, shortness of breath and wheezing.   ?Cardiovascular:  Negative for chest pain, leg swelling and PND.  ?Gastrointestinal:  Negative for abdominal pain, constipation and heartburn.  ?Genitourinary:  Negative for dysuria, flank pain and frequency.  ?Musculoskeletal:  Positive for back pain (Going to the chiropractor).  ?Skin:  Negative for itching and rash.  ?Neurological:  Negative for dizziness, tingling, tremors and headaches.  ?Psychiatric/Behavioral:  Positive for substance abuse (smoke marijuana). Negative for depression, hallucinations and suicidal ideas.   ? ? ?Cardiac Risk Factors include: none ? ?   ?Objective:  ?  ?Today's Vitals  ? 10/15/21 0925  ?BP: 138/80  ?Pulse: 77  ?Weight: 259 lb 9.6 oz (117.8 kg)  ?Height: 6' (1.829 m)  ? ?Body mass index is 35.21 kg/m?. ? ? ?  10/15/2021  ? 10:15 AM 08/03/2017  ?  7:49 AM 07/28/2017  ?  9:34 AM  ?Advanced Directives  ?Does Patient Have a Medical Advance Directive? No No No  ?Would patient like information on creating a medical advance directive?  No - Patient declined No - Patient declined  ? ? ?Current Medications (verified) ?Outpatient Encounter Medications as of 10/15/2021  ?Medication Sig  ? amLODipine (NORVASC) 5 MG tablet Take 1 tablet (5 mg total) by mouth daily.  ? glipiZIDE (GLUCOTROL) 5 MG tablet Take 1 tablet (5 mg total) by mouth 2 (two) times daily before a meal.  ? levothyroxine (SYNTHROID) 175 MCG tablet Take 1 tablet  (175 mcg total) by mouth daily before breakfast.  ? lisinopril-hydrochlorothiazide (ZESTORETIC) 20-25 MG tablet Take 1 tablet by mouth once daily  ? metFORMIN (GLUCOPHAGE) 1000 MG tablet Take 1 tab bid with meals  ? nicotine (NICODERM CQ) 21 mg/24hr patch Place 1 patch (21 mg total) onto the skin daily.  ? pravastatin (PRAVACHOL) 20 MG tablet Take 1 tablet (20 mg total) by mouth daily at 2 PM.  ? tadalafil (CIALIS) 10 MG tablet Take 1 tablet (10 mg total) by mouth as needed for erectile dysfunction.  ? metFORMIN (GLUCOPHAGE) 500 MG tablet Take 1 tablet by mouth once daily with breakfast (Patient not taking: Reported on 10/15/2021)  ? ?No facility-administered encounter medications on file as of 10/15/2021.  ? ? ?Allergies (verified) ?Patient has no known allergies.  ? ?History: ?Past Medical History:  ?Diagnosis Date  ? Anxiety   ? Depression   ? Diabetes mellitus without complication (HCC)   ? Hypertension   ? Hypothyroidism   ? Thyroid disease   ? ?Past Surgical History:  ?Procedure Laterality Date  ? COSMETIC SURGERY  1997  ? from electricution  ? HEMORRHOID SURGERY N/A 08/03/2017  ? Procedure: HEMORRHOIDECTOMY, INTERNAL AND EXTERNAL;  Surgeon: Nadeen Landau, MD;  Location: [facility] ORS;  Service: General;  Laterality: N/A;  ? ?Family History  ?Problem Relation Age of Onset  ? Stroke Mother   ? Pneumonia Father   ? ?Social History  ? ?Socioeconomic History  ?  Marital status: Married  ?  Spouse name: Not on file  ? Number of children: 0  ? Years of education: Not on file  ? Highest education level: Some college, no degree  ?Occupational History  ? Not on file  ?Tobacco Use  ? Smoking status: Every Day  ?  Packs/day: 0.50  ?  Types: Cigarettes  ? Smokeless tobacco: Never  ?Vaping Use  ? Vaping Use: Never used  ?Substance and Sexual Activity  ? Alcohol use: Yes  ?  Alcohol/week: 20.0 standard drinks  ?  Types: 20 Cans of beer per week  ? Drug use: Yes  ?  Types: Marijuana  ? Sexual activity: Yes  ?Other Topics Concern   ? Not on file  ?Social History Narrative  ? Not on file  ? ?Social Determinants of Health  ? ?Financial Resource Strain: Low Risk   ? Difficulty of Paying Living Expenses: Not hard at all  ?Food Insecurity: No Food Insecurity  ? Worried About Programme researcher, broadcasting/film/video in the Last Year: Never true  ? Ran Out of Food in the Last Year: Never true  ?Transportation Needs: No Transportation Needs  ? Lack of Transportation (Medical): No  ? Lack of Transportation (Non-Medical): No  ?Physical Activity: Sufficiently Active  ? Days of Exercise per Week: 5 days  ? Minutes of Exercise per Session: 120 min  ?Stress: No Stress Concern Present  ? Feeling of Stress : Not at all  ?Social Connections: Moderately Integrated  ? Frequency of Communication with Friends and Family: More than three times a week  ? Frequency of Social Gatherings with Friends and Family: Twice a week  ? Attends Religious Services: 1 to 4 times per year  ? Active Member of Clubs or Organizations: No  ? Attends Banker Meetings: Never  ? Marital Status: Married  ? ?Physical Exam ?Constitutional:   ?   Appearance: Normal appearance. He is well-developed. He is obese.  ?HENT:  ?   Head: Normocephalic and atraumatic.  ?   Right Ear: Tympanic membrane normal.  ?   Left Ear: Tympanic membrane normal.  ?   Nose: Nose normal.  ?   Mouth/Throat:  ?   Mouth: Mucous membranes are moist.  ?   Pharynx: Oropharynx is clear.  ?Eyes:  ?   Extraocular Movements: Extraocular movements intact.  ?   Conjunctiva/sclera: Conjunctivae normal.  ?   Pupils: Pupils are equal, round, and reactive to light.  ?Cardiovascular:  ?   Rate and Rhythm: Normal rate and regular rhythm.  ?   Pulses: Normal pulses.  ?   Heart sounds: Normal heart sounds.  ?Pulmonary:  ?   Effort: Pulmonary effort is normal. No respiratory distress.  ?   Breath sounds: Normal breath sounds. No wheezing.  ?Abdominal:  ?   General: Abdomen is protuberant. Bowel sounds are normal.  ?   Palpations: Abdomen is  soft.  ?Musculoskeletal:     ?   General: Normal range of motion.  ?   Cervical back: Normal range of motion and neck supple.  ?Skin: ?   General: Skin is warm.  ?   Capillary Refill: Capillary refill takes less than 2 seconds.  ?Neurological:  ?   General: No focal deficit present.  ?   Mental Status: He is alert and oriented to person, place, and time. Mental status is at baseline.  ?Psychiatric:     ?   Mood and Affect: Mood normal.     ?  Behavior: Behavior normal. Behavior is cooperative.     ?   Thought Content: Thought content normal.     ?   Judgment: Judgment normal.  ? ? ?Tobacco Counseling ?Ready to quit: Not Answered ?Counseling given: Not Answered ? ? ?Clinical Intake: ? ?Pre-visit preparation completed: Yes ? ?Pain : No/denies pain ? ?  ? ?Diabetes: Yes ? ?How often do you need to have someone help you when you read instructions, pamphlets, or other written materials from your doctor or pharmacy?: 1 - Never ?What is the last grade level you completed in school?: 12th grade ? ?Diabetic?Yes ? ?Interpreter Needed?: No ? ?Information entered by :: Garnet SierrasLindsey Johnson CMA ? ? ?Activities of Daily Living ? ?  10/15/2021  ? 10:16 AM  ?In your present state of health, do you have any difficulty performing the following activities:  ?Hearing? 0  ?Vision? 0  ?Difficulty concentrating or making decisions? 0  ?Walking or climbing stairs? 0  ?Dressing or bathing? 0  ?Doing errands, shopping? 0  ?Preparing Food and eating ? N  ?Using the Toilet? N  ?In the past six months, have you accidently leaked urine? N  ?Do you have problems with loss of bowel control? N  ?Managing your Medications? N  ?Managing your Finances? N  ?Housekeeping or managing your Housekeeping? N  ? ? ?Patient Care Team: ?Corky DownsMasoud, Javed, MD as PCP - General (Internal Medicine) ? ?Indicate any recent Medical Services you may have received from other than [facility] providers in the past year (date may be approximate). ? ?   ?Assessment:  ? This is a routine  wellness examination for OGE Energyicholas. ? ?Hearing/Vision screen ?No results found. ? ?Dietary issues and exercise activities discussed: ?Current Exercise Habits: The patient has a physically strenuous job, but

## 2021-10-15 NOTE — Assessment & Plan Note (Signed)
Patient is overall doing fine.  ?He is going to a chiropractor for the back pain.  ?Reviewed the labs. ? Encouraged him to  eat heart healthy diet and follow a regular physical activity routine. ?

## 2021-10-15 NOTE — Assessment & Plan Note (Deleted)
Started him on tadalafil 10 mg as needed ?

## 2021-10-15 NOTE — Progress Notes (Signed)
? ?Subjective:  ? Blake Williamson is a 66 y.o. male who presents for an Initial Medicare Annual Wellness Visit. ? ?Review of Systems    ?N/A ?Cardiac Risk Factors include: none ? ?   ?Objective:  ?  ?Today's Vitals  ? 10/15/21 0925  ?BP: 138/80  ?Pulse: 77  ?Weight: 259 lb 9.6 oz (117.8 kg)  ?Height: 6' (1.829 m)  ? ?Body mass index is 35.21 kg/m?. ? ? ?  10/15/2021  ? 10:15 AM 08/03/2017  ?  7:49 AM 07/28/2017  ?  9:34 AM  ?Advanced Directives  ?Does Patient Have a Medical Advance Directive? No No No  ?Would patient like information on creating a medical advance directive?  No - Patient declined No - Patient declined  ? ? ?Current Medications (verified) ?Outpatient Encounter Medications as of 10/15/2021  ?Medication Sig  ? amLODipine (NORVASC) 5 MG tablet Take 1 tablet (5 mg total) by mouth daily.  ? glipiZIDE (GLUCOTROL) 5 MG tablet Take 1 tablet (5 mg total) by mouth 2 (two) times daily before a meal.  ? levothyroxine (SYNTHROID) 175 MCG tablet Take 1 tablet (175 mcg total) by mouth daily before breakfast.  ? lisinopril-hydrochlorothiazide (ZESTORETIC) 20-25 MG tablet Take 1 tablet by mouth once daily  ? metFORMIN (GLUCOPHAGE) 1000 MG tablet Take 1 tab bid with meals  ? pravastatin (PRAVACHOL) 20 MG tablet Take 1 tablet (20 mg total) by mouth daily at 2 PM.  ? metFORMIN (GLUCOPHAGE) 500 MG tablet Take 1 tablet by mouth once daily with breakfast (Patient not taking: Reported on 10/15/2021)  ? ?No facility-administered encounter medications on file as of 10/15/2021.  ? ? ?Allergies (verified) ?Patient has no known allergies.  ? ?History: ?Past Medical History:  ?Diagnosis Date  ? Anxiety   ? Depression   ? Diabetes mellitus without complication (HCC)   ? Hypertension   ? Hypothyroidism   ? Thyroid disease   ? ?Past Surgical History:  ?Procedure Laterality Date  ? COSMETIC SURGERY  1997  ? from electricution  ? HEMORRHOID SURGERY N/A 08/03/2017  ? Procedure: HEMORRHOIDECTOMY, INTERNAL AND EXTERNAL;  Surgeon: Nadeen Landau, MD;  Location: [facility] ORS;  Service: General;  Laterality: N/A;  ? ?Family History  ?Problem Relation Age of Onset  ? Stroke Mother   ? Pneumonia Father   ? ?Social History  ? ?Socioeconomic History  ? Marital status: Married  ?  Spouse name: Not on file  ? Number of children: 0  ? Years of education: Not on file  ? Highest education level: Some college, no degree  ?Occupational History  ? Not on file  ?Tobacco Use  ? Smoking status: Every Day  ?  Packs/day: 0.50  ?  Types: Cigarettes  ? Smokeless tobacco: Never  ?Vaping Use  ? Vaping Use: Never used  ?Substance and Sexual Activity  ? Alcohol use: Yes  ?  Alcohol/week: 20.0 standard drinks  ?  Types: 20 Cans of beer per week  ? Drug use: Yes  ?  Types: Marijuana  ? Sexual activity: Yes  ?Other Topics Concern  ? Not on file  ?Social History Narrative  ? Not on file  ? ?Social Determinants of Health  ? ?Financial Resource Strain: Low Risk   ? Difficulty of Paying Living Expenses: Not hard at all  ?Food Insecurity: No Food Insecurity  ? Worried About Programme researcher, broadcasting/film/video in the Last Year: Never true  ? Ran Out of Food in the Last Year: Never true  ?  Transportation Needs: No Transportation Needs  ? Lack of Transportation (Medical): No  ? Lack of Transportation (Non-Medical): No  ?Physical Activity: Sufficiently Active  ? Days of Exercise per Week: 5 days  ? Minutes of Exercise per Session: 120 min  ?Stress: No Stress Concern Present  ? Feeling of Stress : Not at all  ?Social Connections: Moderately Integrated  ? Frequency of Communication with Friends and Family: More than three times a week  ? Frequency of Social Gatherings with Friends and Family: Twice a week  ? Attends Religious Services: 1 to 4 times per year  ? Active Member of Clubs or Organizations: No  ? Attends Banker Meetings: Never  ? Marital Status: Married  ? ? ?Tobacco Counseling ?Ready to quit: Not Answered ?Counseling given: Not Answered ? ? ?Clinical Intake: ? ?Pre-visit preparation  completed: Yes ? ?Pain : No/denies pain ? ?  ? ?Diabetes: Yes ? ?How often do you need to have someone help you when you read instructions, pamphlets, or other written materials from your doctor or pharmacy?: 1 - Never ?What is the last grade level you completed in school?: 12th grade ? ?Diabetic?Yes ? ?Interpreter Needed?: No ? ?Information entered by :: Blake Williamson ? ? ?Activities of Daily Living ? ?  10/15/2021  ? 10:16 AM  ?In your present state of health, do you have any difficulty performing the following activities:  ?Hearing? 0  ?Vision? 0  ?Difficulty concentrating or making decisions? 0  ?Walking or climbing stairs? 0  ?Dressing or bathing? 0  ?Doing errands, shopping? 0  ?Preparing Food and eating ? N  ?Using the Toilet? N  ?In the past six months, have you accidently leaked urine? N  ?Do you have problems with loss of bowel control? N  ?Managing your Medications? N  ?Managing your Finances? N  ?Housekeeping or managing your Housekeeping? N  ? ? ?Patient Care Team: ?Corky Downs, MD as PCP - General (Internal Medicine) ? ?Indicate any recent Medical Services you may have received from other than [facility] providers in the past year (date may be approximate). ? ?   ?Assessment:  ? This is a routine wellness examination for Blake Williamson. ? ?Hearing/Vision screen ?No results found. ? ?Dietary issues and exercise activities discussed: ?Current Exercise Habits: The patient has a physically strenuous job, but has no regular exercise apart from work., Exercise limited by: None identified ? ? Goals Addressed   ?None ?  ?Depression Screen ? ?  10/15/2021  ?  9:22 AM 03/15/2021  ? 10:51 AM  ?PHQ 2/9 Scores  ?PHQ - 2 Score 0 0  ?  ?Fall Risk ? ?  10/15/2021  ?  9:21 AM 03/15/2021  ? 10:28 AM  ?Fall Risk   ?Falls in the past year? 0 0  ?Number falls in past yr: 0 0  ?Injury with Fall? 0 0  ?Risk for fall due to : No Fall Risks No Fall Risks  ?Follow up Falls evaluation completed Falls evaluation completed  ? ? ?FALL RISK  PREVENTION PERTAINING TO THE HOME: ? ?Any stairs in or around the home? Yes  ?If so, are there any without handrails? No  ?Home free of loose throw rugs in walkways, pet beds, electrical cords, etc? Yes  ?Adequate lighting in your home to reduce risk of falls? Yes  ? ?ASSISTIVE DEVICES UTILIZED TO PREVENT FALLS: ? ?Life alert? No  ?Use of a cane, walker or w/c? No  ?Grab bars in the bathroom? No  ?  Shower chair or bench in shower? Yes  ?Elevated toilet seat or a handicapped toilet? No  ? ?TIMED UP AND GO: ? ?Was the test performed? No .  ?Length of time to ambulate 10 feet: 0 sec.  ? ? ? ?Cognitive Function: ?  ?  ? ?  10/15/2021  ? 10:17 AM  ?6CIT Screen  ?What Year? 0 points  ?What month? 0 points  ?What time? 0 points  ?Count back from 20 0 points  ?Months in reverse 0 points  ?Repeat phrase 0 points  ?Total Score 0 points  ? ? ?Immunizations ?Immunization History  ?Administered Date(s) Administered  ? PFIZER(Purple Top)SARS-COV-2 Vaccination 01/17/2020, 02/07/2020  ? Td 11/11/2013  ? ? ?TDAP status: Up to date ? ?Flu Vaccine status: Up to date ? ?Pneumococcal vaccine status: Due, Education has been provided regarding the importance of this vaccine. Advised may receive this vaccine at local pharmacy or Health Dept. Aware to provide a copy of the vaccination record if obtained from local pharmacy or Health Dept. Verbalized acceptance and understanding. ? ?Covid-19 vaccine status: Information provided on how to obtain vaccines.  ? ?Qualifies for Shingles Vaccine? No   ?Zostavax completed No   ?Shingrix Completed?: No.    Education has been provided regarding the importance of this vaccine. Patient has been advised to call insurance company to determine out of pocket expense if they have not yet received this vaccine. Advised may also receive vaccine at local pharmacy or Health Dept. Verbalized acceptance and understanding. ? ?Screening Tests ?Health Maintenance  ?Topic Date Due  ? FOOT EXAM  Never done  ?  OPHTHALMOLOGY EXAM  Never done  ? Hepatitis C Screening  Never done  ? COVID-19 Vaccine (3 - Booster for Pfizer series) 10/31/2021 (Originally 04/03/2020)  ? Zoster Vaccines- Shingrix (1 of 2) 01/15/2022 (Originally 3

## 2021-10-15 NOTE — Progress Notes (Signed)
? ?Established Patient Office Visit ? ?Subjective:  ?Patient ID: Blake Williamson, male    DOB: 1956-05-25  Age: 66 y.o. MRN: [id number] ? ?CC:  ?Chief Complaint  ?Patient presents with  ? Annual Exam  ? ? ? ?HPI ? ?Rett Stehlik Funaro  presents for an Initial [employer]. Patient complaints of ED would like to take medication. He also wants to quit smoking and would like to try nicotine patches for smoking cessation ? ?HPI  ? ?Past Medical History:  ?Diagnosis Date  ? Anxiety   ? Depression   ? Diabetes mellitus without complication ([place])   ? Hypertension   ? Hypothyroidism   ? Thyroid disease   ? ? ?Past Surgical History:  ?Procedure Laterality Date  ? COSMETIC SURGERY  1997  ? from electricution  ? HEMORRHOID SURGERY N/A 08/03/2017  ? Procedure: HEMORRHOIDECTOMY, INTERNAL AND EXTERNAL;  Surgeon: Leonie Green, MD;  Location: [facility] ORS;  Service: General;  Laterality: N/A;  ? ? ?Family History  ?Problem Relation Age of Onset  ? Stroke Mother   ? Pneumonia Father   ? ? ?Social History  ? ?Socioeconomic History  ? Marital status: Married  ?  Spouse name: Not on file  ? Number of children: 0  ? Years of education: Not on file  ? Highest education level: Some college, no degree  ?Occupational History  ? Not on file  ?Tobacco Use  ? Smoking status: Every Day  ?  Packs/day: 0.50  ?  Types: Cigarettes  ? Smokeless tobacco: Never  ?Vaping Use  ? Vaping Use: Never used  ?Substance and Sexual Activity  ? Alcohol use: Yes  ?  Alcohol/week: 20.0 standard drinks  ?  Types: 20 Cans of beer per week  ? Drug use: Yes  ?  Types: Marijuana  ? Sexual activity: Yes  ?Other Topics Concern  ? Not on file  ?Social History Narrative  ? Not on file  ? ?Social Determinants of Health  ? ?Financial Resource Strain: Low Risk   ? Difficulty of Paying Living Expenses: Not hard at all  ?Food Insecurity: No Food Insecurity  ? Worried About Charity fundraiser in the Last Year: Never true  ? Ran Out of Food in the Last Year:  Never true  ?Transportation Needs: No Transportation Needs  ? Lack of Transportation (Medical): No  ? Lack of Transportation (Non-Medical): No  ?Physical Activity: Sufficiently Active  ? Days of Exercise per Week: 5 days  ? Minutes of Exercise per Session: 120 min  ?Stress: No Stress Concern Present  ? Feeling of Stress : Not at all  ?Social Connections: Moderately Integrated  ? Frequency of Communication with Friends and Family: More than three times a week  ? Frequency of Social Gatherings with Friends and Family: Twice a week  ? Attends Religious Services: 1 to 4 times per year  ? Active Member of Clubs or Organizations: No  ? Attends Archivist Meetings: Never  ? Marital Status: Married  ?Intimate Partner Violence: Not At Risk  ? Fear of Current or Ex-Partner: No  ? Emotionally Abused: No  ? Physically Abused: No  ? Sexually Abused: No  ? ? ? ?Outpatient Medications Prior to Visit  ?Medication Sig Dispense Refill  ? amLODipine (NORVASC) 5 MG tablet Take 1 tablet (5 mg total) by mouth daily. 90 tablet 1  ? glipiZIDE (GLUCOTROL) 5 MG tablet Take 1 tablet (5 mg total) by mouth 2 (two) times daily  before a meal. 60 tablet 3  ? levothyroxine (SYNTHROID) 175 MCG tablet Take 1 tablet (175 mcg total) by mouth daily before breakfast. 90 tablet 2  ? lisinopril-hydrochlorothiazide (ZESTORETIC) 20-25 MG tablet Take 1 tablet by mouth once daily 90 tablet 0  ? metFORMIN (GLUCOPHAGE) 1000 MG tablet Take 1 tab bid with meals 60 tablet 0  ? pravastatin (PRAVACHOL) 20 MG tablet Take 1 tablet (20 mg total) by mouth daily at 2 PM. 90 tablet 1  ? metFORMIN (GLUCOPHAGE) 500 MG tablet Take 1 tablet by mouth once daily with breakfast (Patient not taking: Reported on 10/15/2021) 90 tablet 0  ? ?No facility-administered medications prior to visit.  ? ? ?No Known Allergies ? ?ROS ?Review of Systems  ?Constitutional:  Negative for activity change and diaphoresis.  ?HENT:  Negative for congestion, mouth sores and sore throat.    ?Eyes:  Negative for photophobia and discharge.  ?Respiratory:  Negative for apnea, shortness of breath and wheezing.   ?Cardiovascular:  Negative for chest pain and palpitations.  ?Gastrointestinal:  Negative for abdominal distention, blood in stool and rectal pain.  ?Endocrine: Negative for cold intolerance and polydipsia.  ?Genitourinary:  Negative for difficulty urinating, frequency and urgency.  ?Musculoskeletal:  Positive for back pain.  ?Skin:  Negative for color change and pallor.  ?Neurological:  Negative for dizziness, facial asymmetry and headaches.  ?Psychiatric/Behavioral:  Negative for agitation, behavioral problems, confusion and dysphoric mood.   ? ?  ?Objective:  ?  ?Physical Exam ?Constitutional:   ?   Appearance: Normal appearance. He is well-developed. He is obese.  ?HENT:  ?   Head: Normocephalic and atraumatic.  ?   Right Ear: Tympanic membrane normal.  ?   Left Ear: Tympanic membrane normal.  ?   Nose: Nose normal.  ?   Mouth/Throat:  ?   Mouth: Mucous membranes are moist.  ?   Pharynx: Oropharynx is clear.  ?Eyes:  ?   Extraocular Movements: Extraocular movements intact.  ?   Conjunctiva/sclera: Conjunctivae normal.  ?   Pupils: Pupils are equal, round, and reactive to light.  ?Cardiovascular:  ?   Rate and Rhythm: Normal rate and regular rhythm.  ?   Pulses: Normal pulses.  ?   Heart sounds: Normal heart sounds.  ?Pulmonary:  ?   Effort: Pulmonary effort is normal.  ?   Breath sounds: Normal breath sounds.  ?Abdominal:  ?   General: Abdomen is protuberant. Bowel sounds are normal.  ?   Palpations: Abdomen is soft.  ?Musculoskeletal:     ?   General: Normal range of motion.  ?   Cervical back: Normal range of motion.  ?Skin: ?   General: Skin is warm.  ?   Capillary Refill: Capillary refill takes less than 2 seconds.  ?Neurological:  ?   General: No focal deficit present.  ?   Mental Status: He is alert and oriented to person, place, and time. Mental status is at baseline.  ?Psychiatric:      ?   Mood and Affect: Mood normal.     ?   Behavior: Behavior normal.     ?   Thought Content: Thought content normal.     ?   Judgment: Judgment normal.  ? ? ?BP 138/80   Pulse 77   Ht 6' (1.829 m)   Wt 259 lb 9.6 oz (117.8 kg)   BMI 35.21 kg/m?  ?Wt Readings from Last 3 Encounters:  ?10/15/21 259 lb 9.6 oz (117.8  kg)  ?08/13/21 261 lb 6.4 oz (118.6 kg)  ?03/15/21 272 lb 8 oz (123.6 kg)  ? ? ? ?Health Maintenance Due  ?Topic Date Due  ? FOOT EXAM  Never done  ? OPHTHALMOLOGY EXAM  Never done  ? Hepatitis C Screening  Never done  ? ? ?There are no preventive care reminders to display for this patient. ? ?Lab Results  ?Component Value Date  ? TSH 0.58 08/23/2021  ? ?Lab Results  ?Component Value Date  ? WBC 7.3 08/23/2021  ? HGB 15.5 08/23/2021  ? HCT 46.2 08/23/2021  ? MCV 89.0 08/23/2021  ? PLT 425 (H) 08/23/2021  ? ?Lab Results  ?Component Value Date  ? NA 136 08/23/2021  ? K 4.9 08/23/2021  ? CO2 25 08/23/2021  ? GLUCOSE 98 08/23/2021  ? BUN 12 08/23/2021  ? CREATININE 1.06 08/23/2021  ? BILITOT 0.5 08/23/2021  ? ALKPHOS 60 08/21/2020  ? AST 21 08/23/2021  ? ALT 31 08/23/2021  ? PROT 6.7 08/23/2021  ? ALBUMIN 4.2 08/21/2020  ? CALCIUM 8.9 08/23/2021  ? ANIONGAP 11 08/21/2020  ? EGFR 78 08/23/2021  ? ?Lab Results  ?Component Value Date  ? CHOL 122 08/23/2021  ? ?Lab Results  ?Component Value Date  ? HDL 52 08/23/2021  ? ?Lab Results  ?Component Value Date  ? [place] 56 08/23/2021  ? ?Lab Results  ?Component Value Date  ? TRIG 55 08/23/2021  ? ?Lab Results  ?Component Value Date  ? CHOLHDL 2.3 08/23/2021  ? ?Lab Results  ?Component Value Date  ? HGBA1C 6.2 (H) 08/23/2021  ? ? ?  ?Assessment & Plan:  ? ?Problem List Items Addressed This Visit   ? ?  ? Endocrine  ? Type 2 diabetes mellitus without complication, without long-term current use of insulin ([place]) - Primary  ?  Patient BS  99  in the office today. ?Advised pt to check the BS regularly, make a log and bring to next appointment.  ?Advised pt to monitor  diet. ?Advised pt to eat variety of food including fruits, vegetables, whole grains, complex carbohydrates and proteins.  ? ?  ?  ? Relevant Orders  ? POCT glucose (manual entry) (Completed)  ?  ? Other  ? Smoke

## 2021-11-11 ENCOUNTER — Other Ambulatory Visit: Payer: Self-pay | Admitting: Internal Medicine

## 2021-11-12 ENCOUNTER — Other Ambulatory Visit: Payer: Self-pay | Admitting: *Deleted

## 2021-11-30 ENCOUNTER — Other Ambulatory Visit: Payer: Self-pay | Admitting: *Deleted

## 2021-11-30 MED ORDER — LISINOPRIL-HYDROCHLOROTHIAZIDE 20-25 MG PO TABS: 1.0000 | ORAL_TABLET | Freq: Every day | ORAL | 3 refills | Status: AC

## 2021-11-30 MED ORDER — METFORMIN HCL 500 MG PO TABS: 500.0000 mg | ORAL_TABLET | Freq: Every day | ORAL | 3 refills | Status: AC

## 2021-11-30 MED ORDER — PRAVASTATIN SODIUM 20 MG PO TABS: 20.0000 mg | ORAL_TABLET | Freq: Every day | ORAL | 3 refills | Status: AC

## 2021-12-24 ENCOUNTER — Ambulatory Visit: Payer: Medicare HMO | Admitting: Nurse Practitioner

## 2021-12-24 NOTE — Progress Notes (Deleted)
Established Patient Office Visit  Subjective:  Patient ID: Blake Williamson, male    DOB: 05-30-1956  Age: 66 y.o. MRN: [id number]  CC:  No chief complaint on file.    HPI  Blake Williamson  presents for 2 months follow-up blood pressure and diabetes.  Patient blood sugar at home ranges between?????  Patient complaints of ED would like to take medication. He also wants to quit smoking and would like to try nicotine patches for smoking cessation?????  HPI   Past Medical History:  Diagnosis Date   Anxiety    Depression    Diabetes mellitus without complication (HCC)    Hypertension    Hypothyroidism    Thyroid disease     Past Surgical History:  Procedure Laterality Date   COSMETIC SURGERY  1997   from electricution   HEMORRHOID SURGERY N/A 08/03/2017   Procedure: HEMORRHOIDECTOMY, INTERNAL AND EXTERNAL;  Surgeon: Nadeen Landau, MD;  Location: [facility] ORS;  Service: General;  Laterality: N/A;    Family History  Problem Relation Age of Onset   Stroke Mother    Pneumonia Father     Social History   Socioeconomic History   Marital status: Married    Spouse name: Not on file   Number of children: 0   Years of education: Not on file   Highest education level: Some college, no degree  Occupational History   Not on file  Tobacco Use   Smoking status: Every Day    Packs/day: 0.50    Types: Cigarettes   Smokeless tobacco: Never  Vaping Use   Vaping Use: Never used  Substance and Sexual Activity   Alcohol use: Yes    Alcohol/week: 20.0 standard drinks of alcohol    Types: 20 Cans of beer per week   Drug use: Yes    Types: Marijuana   Sexual activity: Yes  Other Topics Concern   Not on file  Social History Narrative   Not on file   Social Determinants of Health   Financial Resource Strain: Low Risk  (10/15/2021)   Overall Financial Resource Strain (CARDIA)    Difficulty of Paying Living Expenses: Not hard at all  Food Insecurity: No Food  Insecurity (10/15/2021)   Hunger Vital Sign    Worried About Running Out of Food in the Last Year: Never true    Ran Out of Food in the Last Year: Never true  Transportation Needs: No Transportation Needs (10/15/2021)   PRAPARE - Administrator, Civil Service (Medical): No    Lack of Transportation (Non-Medical): No  Physical Activity: Sufficiently Active (10/15/2021)   Exercise Vital Sign    Days of Exercise per Week: 5 days    Minutes of Exercise per Session: 120 min  Stress: No Stress Concern Present (10/15/2021)   [employer] of Occupational Health - Occupational Stress Questionnaire    Feeling of Stress : Not at all  Social Connections: Moderately Integrated (10/15/2021)   Social Connection and Isolation Panel [NHANES]    Frequency of Communication with Friends and Family: More than three times a week    Frequency of Social Gatherings with Friends and Family: Twice a week    Attends Religious Services: 1 to 4 times per year    Active Member of [employer] or Organizations: No    Attends Banker Meetings: Never    Marital Status: Married  Catering manager Violence: Not At Risk (10/15/2021)   Humiliation, Afraid, Rape, and Kick  questionnaire    Fear of Current or Ex-Partner: No    Emotionally Abused: No    Physically Abused: No    Sexually Abused: No     Outpatient Medications Prior to Visit  Medication Sig Dispense Refill   amLODipine (NORVASC) 5 MG tablet Take 1 tablet (5 mg total) by mouth daily. 90 tablet 1   glipiZIDE (GLUCOTROL) 5 MG tablet Take 1 tablet (5 mg total) by mouth 2 (two) times daily before a meal. 60 tablet 3   levothyroxine (SYNTHROID) 175 MCG tablet Take 1 tablet (175 mcg total) by mouth daily before breakfast. 90 tablet 2   lisinopril-hydrochlorothiazide (ZESTORETIC) 20-25 MG tablet Take 1 tablet by mouth daily. 90 tablet 3   metFORMIN (GLUCOPHAGE) 1000 MG tablet Take 1 tab bid with meals 60 tablet 0   metFORMIN (GLUCOPHAGE) 500 MG tablet  Take 1 tablet (500 mg total) by mouth daily with breakfast. 90 tablet 3   nicotine (NICODERM CQ) 21 mg/24hr patch Place 1 patch (21 mg total) onto the skin daily. 28 patch 0   pravastatin (PRAVACHOL) 20 MG tablet Take 1 tablet (20 mg total) by mouth daily at 2 PM. 90 tablet 3   tadalafil (CIALIS) 10 MG tablet Take 1 tablet (10 mg total) by mouth as needed for erectile dysfunction. 10 tablet 1   No facility-administered medications prior to visit.    No Known Allergies  ROS Review of Systems  Constitutional:  Negative for activity change and diaphoresis.  HENT:  Negative for congestion, mouth sores and sore throat.   Eyes:  Negative for photophobia and discharge.  Respiratory:  Negative for apnea, shortness of breath and wheezing.   Cardiovascular:  Negative for chest pain and palpitations.  Gastrointestinal:  Negative for abdominal distention, blood in stool and rectal pain.  Endocrine: Negative for cold intolerance and polydipsia.  Genitourinary:  Negative for difficulty urinating, frequency and urgency.  Musculoskeletal:  Positive for back pain.  Skin:  Negative for color change and pallor.  Neurological:  Negative for dizziness, facial asymmetry and headaches.  Psychiatric/Behavioral:  Negative for agitation, behavioral problems, confusion and dysphoric mood.       Objective:    Physical Exam Constitutional:      Appearance: Normal appearance. He is well-developed. He is obese.  HENT:     Head: Normocephalic and atraumatic.     Right Ear: Tympanic membrane normal.     Left Ear: Tympanic membrane normal.     Nose: Nose normal.     Mouth/Throat:     Mouth: Mucous membranes are moist.     Pharynx: Oropharynx is clear.  Eyes:     Extraocular Movements: Extraocular movements intact.     Conjunctiva/sclera: Conjunctivae normal.     Pupils: Pupils are equal, round, and reactive to light.  Cardiovascular:     Rate and Rhythm: Normal rate and regular rhythm.     Pulses: Normal  pulses.     Heart sounds: Normal heart sounds.  Pulmonary:     Effort: Pulmonary effort is normal.     Breath sounds: Normal breath sounds.  Abdominal:     General: Abdomen is protuberant. Bowel sounds are normal.     Palpations: Abdomen is soft.  Musculoskeletal:        General: Normal range of motion.     Cervical back: Normal range of motion.  Skin:    General: Skin is warm.     Capillary Refill: Capillary refill takes less than 2 seconds.  Neurological:     General: No focal deficit present.     Mental Status: He is alert and oriented to person, place, and time. Mental status is at baseline.  Psychiatric:        Mood and Affect: Mood normal.        Behavior: Behavior normal.        Thought Content: Thought content normal.        Judgment: Judgment normal.     There were no vitals taken for this visit. Wt Readings from Last 3 Encounters:  10/15/21 259 lb 9.6 oz (117.8 kg)  08/13/21 261 lb 6.4 oz (118.6 kg)  03/15/21 272 lb 8 oz (123.6 kg)     Health Maintenance Due  Topic Date Due   FOOT EXAM  Never done   OPHTHALMOLOGY EXAM  Never done   Hepatitis C Screening  Never done   COVID-19 Vaccine (3 - Pfizer series) 04/03/2020    There are no preventive care reminders to display for this patient.  Lab Results  Component Value Date   TSH 0.58 08/23/2021   Lab Results  Component Value Date   WBC 7.3 08/23/2021   HGB 15.5 08/23/2021   HCT 46.2 08/23/2021   MCV 89.0 08/23/2021   PLT 425 (H) 08/23/2021   Lab Results  Component Value Date   NA 136 08/23/2021   K 4.9 08/23/2021   CO2 25 08/23/2021   GLUCOSE 98 08/23/2021   BUN 12 08/23/2021   CREATININE 1.06 08/23/2021   BILITOT 0.5 08/23/2021   ALKPHOS 60 08/21/2020   AST 21 08/23/2021   ALT 31 08/23/2021   PROT 6.7 08/23/2021   ALBUMIN 4.2 08/21/2020   CALCIUM 8.9 08/23/2021   ANIONGAP 11 08/21/2020   EGFR 78 08/23/2021   Lab Results  Component Value Date   CHOL 122 08/23/2021   Lab Results   Component Value Date   HDL 52 08/23/2021   Lab Results  Component Value Date   LDLCALC 56 08/23/2021   Lab Results  Component Value Date   TRIG 55 08/23/2021   Lab Results  Component Value Date   CHOLHDL 2.3 08/23/2021   Lab Results  Component Value Date   HGBA1C 6.2 (H) 08/23/2021      Assessment & Plan:   Problem List Items Addressed This Visit       Cardiovascular and Mediastinum   Primary hypertension     Endocrine   Type 2 diabetes mellitus without complication, without long-term current use of insulin ([place]) - Primary   Hypothyroidism     Other   Smoker     No orders of the defined types were placed in this encounter.    Follow-up: No follow-ups on file.    Theresia Lo, NP

## 2022-01-07 ENCOUNTER — Other Ambulatory Visit: Payer: Self-pay

## 2022-01-07 MED ORDER — LEVOTHYROXINE SODIUM 175 MCG PO TABS: 175.0000 ug | ORAL_TABLET | Freq: Every day | ORAL | 2 refills | Status: DC

## 2022-03-30 ENCOUNTER — Other Ambulatory Visit: Payer: Self-pay | Admitting: *Deleted

## 2022-03-30 MED ORDER — LEVOTHYROXINE SODIUM 175 MCG PO TABS: 175.0000 ug | ORAL_TABLET | Freq: Every day | ORAL | 3 refills | Status: AC

## 2022-04-05 DIAGNOSIS — M9903 Segmental and somatic dysfunction of lumbar region: Secondary | ICD-10-CM | POA: Diagnosis not present

## 2022-04-05 DIAGNOSIS — M9904 Segmental and somatic dysfunction of sacral region: Secondary | ICD-10-CM | POA: Diagnosis not present

## 2022-04-05 DIAGNOSIS — M545 Low back pain, unspecified: Secondary | ICD-10-CM | POA: Diagnosis not present

## 2022-04-05 DIAGNOSIS — M9905 Segmental and somatic dysfunction of pelvic region: Secondary | ICD-10-CM | POA: Diagnosis not present

## 2022-04-06 ENCOUNTER — Other Ambulatory Visit: Payer: Self-pay | Admitting: *Deleted

## 2022-04-06 MED ORDER — MELOXICAM 7.5 MG PO TABS: 7.5000 mg | ORAL_TABLET | Freq: Every day | ORAL | 0 refills | Status: DC

## 2022-04-11 DIAGNOSIS — M9903 Segmental and somatic dysfunction of lumbar region: Secondary | ICD-10-CM | POA: Diagnosis not present

## 2022-04-11 DIAGNOSIS — M9904 Segmental and somatic dysfunction of sacral region: Secondary | ICD-10-CM | POA: Diagnosis not present

## 2022-04-11 DIAGNOSIS — M461 Sacroiliitis, not elsewhere classified: Secondary | ICD-10-CM | POA: Diagnosis not present

## 2022-04-11 DIAGNOSIS — M5451 Vertebrogenic low back pain: Secondary | ICD-10-CM | POA: Diagnosis not present

## 2022-04-12 DIAGNOSIS — M9904 Segmental and somatic dysfunction of sacral region: Secondary | ICD-10-CM | POA: Diagnosis not present

## 2022-04-12 DIAGNOSIS — M9903 Segmental and somatic dysfunction of lumbar region: Secondary | ICD-10-CM | POA: Diagnosis not present

## 2022-04-12 DIAGNOSIS — M461 Sacroiliitis, not elsewhere classified: Secondary | ICD-10-CM | POA: Diagnosis not present

## 2022-04-12 DIAGNOSIS — M5451 Vertebrogenic low back pain: Secondary | ICD-10-CM | POA: Diagnosis not present

## 2022-04-18 DIAGNOSIS — M9904 Segmental and somatic dysfunction of sacral region: Secondary | ICD-10-CM | POA: Diagnosis not present

## 2022-04-18 DIAGNOSIS — M461 Sacroiliitis, not elsewhere classified: Secondary | ICD-10-CM | POA: Diagnosis not present

## 2022-04-18 DIAGNOSIS — M5451 Vertebrogenic low back pain: Secondary | ICD-10-CM | POA: Diagnosis not present

## 2022-04-18 DIAGNOSIS — M9903 Segmental and somatic dysfunction of lumbar region: Secondary | ICD-10-CM | POA: Diagnosis not present

## 2022-04-22 DIAGNOSIS — M5451 Vertebrogenic low back pain: Secondary | ICD-10-CM | POA: Diagnosis not present

## 2022-04-22 DIAGNOSIS — M461 Sacroiliitis, not elsewhere classified: Secondary | ICD-10-CM | POA: Diagnosis not present

## 2022-04-22 DIAGNOSIS — M9903 Segmental and somatic dysfunction of lumbar region: Secondary | ICD-10-CM | POA: Diagnosis not present

## 2022-04-22 DIAGNOSIS — M9904 Segmental and somatic dysfunction of sacral region: Secondary | ICD-10-CM | POA: Diagnosis not present

## 2022-04-29 DIAGNOSIS — M461 Sacroiliitis, not elsewhere classified: Secondary | ICD-10-CM | POA: Diagnosis not present

## 2022-04-29 DIAGNOSIS — M5451 Vertebrogenic low back pain: Secondary | ICD-10-CM | POA: Diagnosis not present

## 2022-04-29 DIAGNOSIS — M9904 Segmental and somatic dysfunction of sacral region: Secondary | ICD-10-CM | POA: Diagnosis not present

## 2022-04-29 DIAGNOSIS — M9903 Segmental and somatic dysfunction of lumbar region: Secondary | ICD-10-CM | POA: Diagnosis not present

## 2022-05-03 ENCOUNTER — Other Ambulatory Visit: Payer: Self-pay | Admitting: Internal Medicine

## 2022-05-21 ENCOUNTER — Other Ambulatory Visit: Payer: Self-pay | Admitting: Internal Medicine

## 2022-05-26 ENCOUNTER — Other Ambulatory Visit: Payer: Self-pay

## 2022-05-26 MED ORDER — GLIPIZIDE 5 MG PO TABS: 5.0000 mg | ORAL_TABLET | Freq: Two times a day (BID) | ORAL | 1 refills | Status: AC

## 2022-06-02 ENCOUNTER — Ambulatory Visit: Payer: Medicare HMO | Admitting: Nurse Practitioner

## 2022-06-16 ENCOUNTER — Ambulatory Visit: Payer: Medicare HMO | Admitting: Nurse Practitioner

## 2023-05-24 ENCOUNTER — Encounter: Payer: Self-pay | Admitting: *Deleted

## 2023-08-23 ENCOUNTER — Ambulatory Visit: Payer: Medicare HMO | Admitting: Dermatology
